# Patient Record
Sex: Female | Born: 1935 | Race: White | Hispanic: No | Smoking: Never smoker
Health system: Southern US, Community
[De-identification: ages and names within clinical notes are randomized; demographics above are authoritative.]

## PROBLEM LIST (undated history)

## (undated) DIAGNOSIS — N184 Chronic kidney disease, stage 4 (severe): Secondary | ICD-10-CM

## (undated) DIAGNOSIS — I82409 Acute embolism and thrombosis of unspecified deep veins of unspecified lower extremity: Secondary | ICD-10-CM

## (undated) DIAGNOSIS — L0390 Cellulitis, unspecified: Secondary | ICD-10-CM

## (undated) DIAGNOSIS — I10 Essential (primary) hypertension: Secondary | ICD-10-CM

## (undated) DIAGNOSIS — I4891 Unspecified atrial fibrillation: Secondary | ICD-10-CM

## (undated) DIAGNOSIS — E119 Type 2 diabetes mellitus without complications: Secondary | ICD-10-CM

## (undated) DIAGNOSIS — G20A1 Parkinson's disease without dyskinesia, without mention of fluctuations: Secondary | ICD-10-CM

## (undated) DIAGNOSIS — D649 Anemia, unspecified: Secondary | ICD-10-CM

## (undated) DIAGNOSIS — M549 Dorsalgia, unspecified: Secondary | ICD-10-CM

## (undated) DIAGNOSIS — G20 Parkinson's disease: Secondary | ICD-10-CM

## (undated) HISTORY — PX: APPENDECTOMY: SHX54

## (undated) HISTORY — PX: TONSILLECTOMY: SUR1361

## (undated) HISTORY — PX: CHOLECYSTECTOMY: SHX55

## (undated) HISTORY — PX: KNEE ARTHROSCOPY: SUR90

---

## 1998-01-19 ENCOUNTER — Ambulatory Visit (HOSPITAL_COMMUNITY): Admission: RE | Admit: 1998-01-19 | Discharge: 1998-01-19 | Payer: Self-pay | Admitting: Obstetrics & Gynecology

## 1998-07-04 ENCOUNTER — Ambulatory Visit (HOSPITAL_COMMUNITY): Admission: RE | Admit: 1998-07-04 | Discharge: 1998-07-04 | Payer: Self-pay

## 1998-07-05 ENCOUNTER — Ambulatory Visit (HOSPITAL_COMMUNITY): Admission: RE | Admit: 1998-07-05 | Discharge: 1998-07-05 | Payer: Self-pay | Admitting: Internal Medicine

## 1998-07-17 ENCOUNTER — Ambulatory Visit (HOSPITAL_COMMUNITY): Admission: RE | Admit: 1998-07-17 | Discharge: 1998-07-17 | Payer: Self-pay

## 1999-09-06 ENCOUNTER — Ambulatory Visit (HOSPITAL_COMMUNITY): Admission: RE | Admit: 1999-09-06 | Discharge: 1999-09-06 | Payer: Self-pay | Admitting: Internal Medicine

## 2000-11-03 ENCOUNTER — Encounter: Payer: Self-pay | Admitting: Internal Medicine

## 2000-11-03 ENCOUNTER — Ambulatory Visit (HOSPITAL_COMMUNITY): Admission: RE | Admit: 2000-11-03 | Discharge: 2000-11-03 | Payer: Self-pay | Admitting: Internal Medicine

## 2002-01-26 ENCOUNTER — Ambulatory Visit (HOSPITAL_COMMUNITY): Admission: RE | Admit: 2002-01-26 | Discharge: 2002-01-26 | Payer: Self-pay | Admitting: Internal Medicine

## 2002-01-26 ENCOUNTER — Encounter: Payer: Self-pay | Admitting: Internal Medicine

## 2003-02-17 ENCOUNTER — Ambulatory Visit (HOSPITAL_COMMUNITY): Admission: RE | Admit: 2003-02-17 | Discharge: 2003-02-17 | Payer: Self-pay | Admitting: Internal Medicine

## 2003-02-17 ENCOUNTER — Encounter: Payer: Self-pay | Admitting: Internal Medicine

## 2016-01-07 ENCOUNTER — Emergency Department (HOSPITAL_BASED_OUTPATIENT_CLINIC_OR_DEPARTMENT_OTHER): Payer: Medicare Other

## 2016-01-07 ENCOUNTER — Inpatient Hospital Stay (HOSPITAL_BASED_OUTPATIENT_CLINIC_OR_DEPARTMENT_OTHER)
Admission: EM | Admit: 2016-01-07 | Discharge: 2016-01-13 | DRG: 469 | Disposition: A | Discharge status: Skilled Nursing Facility | Payer: Medicare Other | Attending: Internal Medicine | Admitting: Internal Medicine

## 2016-01-07 ENCOUNTER — Encounter (HOSPITAL_BASED_OUTPATIENT_CLINIC_OR_DEPARTMENT_OTHER): Payer: Self-pay | Admitting: Emergency Medicine

## 2016-01-07 DIAGNOSIS — Z9049 Acquired absence of other specified parts of digestive tract: Secondary | ICD-10-CM

## 2016-01-07 DIAGNOSIS — I480 Paroxysmal atrial fibrillation: Secondary | ICD-10-CM | POA: Diagnosis present

## 2016-01-07 DIAGNOSIS — D62 Acute posthemorrhagic anemia: Secondary | ICD-10-CM | POA: Diagnosis not present

## 2016-01-07 DIAGNOSIS — S72009A Fracture of unspecified part of neck of unspecified femur, initial encounter for closed fracture: Secondary | ICD-10-CM | POA: Diagnosis present

## 2016-01-07 DIAGNOSIS — J9811 Atelectasis: Secondary | ICD-10-CM | POA: Diagnosis not present

## 2016-01-07 DIAGNOSIS — L03115 Cellulitis of right lower limb: Secondary | ICD-10-CM | POA: Diagnosis present

## 2016-01-07 DIAGNOSIS — D638 Anemia in other chronic diseases classified elsewhere: Secondary | ICD-10-CM | POA: Diagnosis present

## 2016-01-07 DIAGNOSIS — G20 Parkinson's disease: Secondary | ICD-10-CM | POA: Diagnosis present

## 2016-01-07 DIAGNOSIS — Z882 Allergy status to sulfonamides status: Secondary | ICD-10-CM

## 2016-01-07 DIAGNOSIS — Y9301 Activity, walking, marching and hiking: Secondary | ICD-10-CM | POA: Diagnosis present

## 2016-01-07 DIAGNOSIS — Z7901 Long term (current) use of anticoagulants: Secondary | ICD-10-CM | POA: Diagnosis not present

## 2016-01-07 DIAGNOSIS — I9581 Postprocedural hypotension: Secondary | ICD-10-CM | POA: Diagnosis not present

## 2016-01-07 DIAGNOSIS — S72001A Fracture of unspecified part of neck of right femur, initial encounter for closed fracture: Secondary | ICD-10-CM | POA: Diagnosis present

## 2016-01-07 DIAGNOSIS — I482 Chronic atrial fibrillation, unspecified: Secondary | ICD-10-CM

## 2016-01-07 DIAGNOSIS — Z86718 Personal history of other venous thrombosis and embolism: Secondary | ICD-10-CM

## 2016-01-07 DIAGNOSIS — W1830XA Fall on same level, unspecified, initial encounter: Secondary | ICD-10-CM | POA: Diagnosis present

## 2016-01-07 DIAGNOSIS — F329 Major depressive disorder, single episode, unspecified: Secondary | ICD-10-CM | POA: Diagnosis present

## 2016-01-07 DIAGNOSIS — N184 Chronic kidney disease, stage 4 (severe): Secondary | ICD-10-CM | POA: Diagnosis present

## 2016-01-07 DIAGNOSIS — G20A1 Parkinson's disease without dyskinesia, without mention of fluctuations: Secondary | ICD-10-CM

## 2016-01-07 DIAGNOSIS — S72011A Unspecified intracapsular fracture of right femur, initial encounter for closed fracture: Secondary | ICD-10-CM | POA: Diagnosis present

## 2016-01-07 DIAGNOSIS — Z881 Allergy status to other antibiotic agents status: Secondary | ICD-10-CM

## 2016-01-07 DIAGNOSIS — I130 Hypertensive heart and chronic kidney disease with heart failure and stage 1 through stage 4 chronic kidney disease, or unspecified chronic kidney disease: Secondary | ICD-10-CM | POA: Diagnosis present

## 2016-01-07 DIAGNOSIS — G4733 Obstructive sleep apnea (adult) (pediatric): Secondary | ICD-10-CM | POA: Diagnosis present

## 2016-01-07 DIAGNOSIS — R1310 Dysphagia, unspecified: Secondary | ICD-10-CM | POA: Diagnosis present

## 2016-01-07 DIAGNOSIS — R609 Edema, unspecified: Secondary | ICD-10-CM

## 2016-01-07 DIAGNOSIS — R0902 Hypoxemia: Secondary | ICD-10-CM

## 2016-01-07 DIAGNOSIS — Z419 Encounter for procedure for purposes other than remedying health state, unspecified: Secondary | ICD-10-CM

## 2016-01-07 DIAGNOSIS — I5033 Acute on chronic diastolic (congestive) heart failure: Secondary | ICD-10-CM | POA: Diagnosis not present

## 2016-01-07 DIAGNOSIS — Z888 Allergy status to other drugs, medicaments and biological substances status: Secondary | ICD-10-CM | POA: Diagnosis not present

## 2016-01-07 DIAGNOSIS — W19XXXA Unspecified fall, initial encounter: Secondary | ICD-10-CM

## 2016-01-07 DIAGNOSIS — Y92009 Unspecified place in unspecified non-institutional (private) residence as the place of occurrence of the external cause: Secondary | ICD-10-CM | POA: Diagnosis not present

## 2016-01-07 DIAGNOSIS — J80 Acute respiratory distress syndrome: Secondary | ICD-10-CM | POA: Diagnosis not present

## 2016-01-07 DIAGNOSIS — Z993 Dependence on wheelchair: Secondary | ICD-10-CM

## 2016-01-07 DIAGNOSIS — E1122 Type 2 diabetes mellitus with diabetic chronic kidney disease: Secondary | ICD-10-CM | POA: Diagnosis present

## 2016-01-07 DIAGNOSIS — S72001D Fracture of unspecified part of neck of right femur, subsequent encounter for closed fracture with routine healing: Secondary | ICD-10-CM | POA: Diagnosis not present

## 2016-01-07 DIAGNOSIS — J9601 Acute respiratory failure with hypoxia: Secondary | ICD-10-CM | POA: Diagnosis not present

## 2016-01-07 DIAGNOSIS — M25521 Pain in right elbow: Secondary | ICD-10-CM | POA: Diagnosis present

## 2016-01-07 DIAGNOSIS — Z09 Encounter for follow-up examination after completed treatment for conditions other than malignant neoplasm: Secondary | ICD-10-CM

## 2016-01-07 DIAGNOSIS — N179 Acute kidney failure, unspecified: Secondary | ICD-10-CM | POA: Diagnosis not present

## 2016-01-07 DIAGNOSIS — M25552 Pain in left hip: Secondary | ICD-10-CM | POA: Diagnosis present

## 2016-01-07 HISTORY — DX: Unspecified atrial fibrillation: I48.91

## 2016-01-07 HISTORY — DX: Essential (primary) hypertension: I10

## 2016-01-07 HISTORY — DX: Cellulitis, unspecified: L03.90

## 2016-01-07 HISTORY — DX: Anemia, unspecified: D64.9

## 2016-01-07 HISTORY — DX: Acute embolism and thrombosis of unspecified deep veins of unspecified lower extremity: I82.409

## 2016-01-07 HISTORY — DX: Parkinson's disease without dyskinesia, without mention of fluctuations: G20.A1

## 2016-01-07 HISTORY — DX: Parkinson's disease: G20

## 2016-01-07 HISTORY — DX: Chronic kidney disease, stage 4 (severe): N18.4

## 2016-01-07 HISTORY — DX: Dorsalgia, unspecified: M54.9

## 2016-01-07 HISTORY — DX: Type 2 diabetes mellitus without complications: E11.9

## 2016-01-07 LAB — CBC WITH DIFFERENTIAL/PLATELET
BASOS PCT: 0 %
Band Neutrophils: 1 %
Basophils Absolute: 0 10*3/uL (ref 0.0–0.1)
Blasts: 0 %
EOS ABS: 0.3 10*3/uL (ref 0.0–0.7)
EOS PCT: 4 %
HCT: 32.9 % — ABNORMAL LOW (ref 36.0–46.0)
Hemoglobin: 10.4 g/dL — ABNORMAL LOW (ref 12.0–15.0)
Lymphocytes Relative: 4 %
Lymphs Abs: 0.3 10*3/uL — ABNORMAL LOW (ref 0.7–4.0)
MCH: 29.4 pg (ref 26.0–34.0)
MCHC: 31.6 g/dL (ref 30.0–36.0)
MCV: 92.9 fL (ref 78.0–100.0)
METAMYELOCYTES PCT: 1 %
MONO ABS: 0.4 10*3/uL (ref 0.1–1.0)
MYELOCYTES: 0 %
Monocytes Relative: 5 %
Neutro Abs: 6.6 10*3/uL (ref 1.7–7.7)
Neutrophils Relative %: 85 %
PLATELETS: 232 10*3/uL (ref 150–400)
PROMYELOCYTES ABS: 0 %
RBC: 3.54 MIL/uL — ABNORMAL LOW (ref 3.87–5.11)
RDW: 16.8 % — ABNORMAL HIGH (ref 11.5–15.5)
WBC: 7.6 10*3/uL (ref 4.0–10.5)
nRBC: 0 /100 WBC

## 2016-01-07 LAB — BASIC METABOLIC PANEL
Anion gap: 10 (ref 5–15)
BUN: 36 mg/dL — AB (ref 6–20)
CALCIUM: 8.9 mg/dL (ref 8.9–10.3)
CHLORIDE: 106 mmol/L (ref 101–111)
CO2: 23 mmol/L (ref 22–32)
CREATININE: 1.86 mg/dL — AB (ref 0.44–1.00)
GFR calc Af Amer: 28 mL/min — ABNORMAL LOW (ref >60)
GFR calc non Af Amer: 24 mL/min — ABNORMAL LOW (ref >60)
Glucose, Bld: 130 mg/dL — ABNORMAL HIGH (ref 65–99)
Potassium: 3.7 mmol/L (ref 3.5–5.1)
SODIUM: 139 mmol/L (ref 135–145)

## 2016-01-07 LAB — PROTIME-INR
INR: 1.35
PROTHROMBIN TIME: 16.8 s — AB (ref 11.4–15.2)

## 2016-01-07 LAB — CBG MONITORING, ED
GLUCOSE-CAPILLARY: 153 mg/dL — AB (ref 65–99)
Glucose-Capillary: 128 mg/dL — ABNORMAL HIGH (ref 65–99)

## 2016-01-07 MED ORDER — FENTANYL CITRATE (PF) 100 MCG/2ML IJ SOLN
25.0000 ug | Freq: Once | INTRAMUSCULAR | Status: AC
  Administered 2016-01-07: 25 ug via INTRAVENOUS
  Filled 2016-01-07: qty 2

## 2016-01-07 MED ORDER — ACETAMINOPHEN 325 MG PO TABS
650.0000 mg | ORAL_TABLET | Freq: Once | ORAL | Status: AC
  Administered 2016-01-07: 650 mg via ORAL
  Filled 2016-01-07: qty 2

## 2016-01-07 MED ORDER — SODIUM CHLORIDE 0.9 % IV SOLN
INTRAVENOUS | Status: AC
  Administered 2016-01-07 (×2): via INTRAVENOUS

## 2016-01-07 NOTE — ED Notes (Signed)
Pt undressed and in gown. Son at bedside.

## 2016-01-07 NOTE — ED Notes (Signed)
Contacted Dr. Victorino DikeHewitt @ [phone] for consult

## 2016-01-07 NOTE — ED Notes (Signed)
Pt is non ambulatory per son. Pt go up out of her wheelchair and landed on tile floor. Unsure if she hit her head. Pt is c/o R arm pain and L hip pain.

## 2016-01-07 NOTE — ED Triage Notes (Signed)
Per ems, patient slipped and fell while walking at home. Patient is guarding right hip, c/o arm pain & neck pain

## 2016-01-07 NOTE — ED Notes (Signed)
CBG is 153. 

## 2016-01-07 NOTE — ED Notes (Signed)
Pt pulled IV out "because it was hurting". New IV reinserted, patient told to call RN if IV causes pain.

## 2016-01-07 NOTE — ED Notes (Signed)
Pt in 9/10 pain. Asking for pain medication. RN notified.

## 2016-01-07 NOTE — ED Notes (Signed)
[facility] notified Delice Bison(Tara) for hospitalist @ [facility]

## 2016-01-07 NOTE — ED Provider Notes (Signed)
[facility] Provider Note   CSN: [id number] Arrival date & time: 01/07/16  1510 By signing my name below, I, Levon Hedger, attest that this documentation has been prepared under the direction and in the presence of Nelva Nay, MD . Electronically Signed: Levon Hedger, Scribe. 01/07/2016. 6:30 PM  History   Chief Complaint Chief Complaint  Patient presents with  . Fall    HPI Sheila Watkins is a 82 y.o. female with PMHx of Parkinson's, HTN, A-fib, and DM who presents to the Emergency Department s/p fall which occurred at approximately 3 this afternoon. Pt complains of sudden onset right elbow pain. She also complains of left hip pain, which son states chronic, but has worsened after the fall. No alleviating factors noted. Per son, pt fell while walking at home. The fall was unwitnessed and pt's son is unsure of any head injury. Pt is quiet and mostly nonverbal during exam. Per son, "her quietness comes and goes". Pt's son denies O2 usage at home. She is currently taking anticoagulants. Pt is allergic to Sulfa, Clonidine, and Lisinopril.   The history is provided by the patient and a relative. The history is limited by the condition of the patient. No language interpreter was used.   Past Medical History:  Diagnosis Date  . Atrial fibrillation (HCC)   . Back pain   . Cellulitis   . Diabetes mellitus without complication (HCC)   . DVT (deep venous thrombosis) (HCC)   . Hypertension   . Parkinson disease [facility])     Patient Active Problem List   Diagnosis Date Noted  . Hip fracture (HCC) 01/07/2016    Past Surgical History:  Procedure Laterality Date  . APPENDECTOMY    . CHOLECYSTECTOMY    . TONSILLECTOMY      OB History    No data available       Home Medications    Prior to Admission medications   Medication Sig Start Date End Date Taking? Authorizing Provider  ALPRAZolam Prudy Feeler) 0.25 MG tablet Take 0.25 mg by mouth at bedtime as needed for anxiety.    Yes Historical Provider, MD  amlodipine-benazepril (LOTREL) 2.5-10 MG capsule Take 1 capsule by mouth daily.   Yes Historical Provider, MD  carbidopa-levodopa (SINEMET IR) 25-100 MG tablet Take 1 tablet by mouth 3 (three) times daily.   Yes Historical Provider, MD  furosemide (LASIX) 20 MG tablet Take 20 mg by mouth.   Yes Historical Provider, MD  gabapentin (NEURONTIN) 100 MG capsule Take 100 mg by mouth 3 (three) times daily.   Yes Historical Provider, MD  HYDROcodone-acetaminophen (NORCO) 10-325 MG tablet Take 1 tablet by mouth every 6 (six) hours as needed.   Yes Historical Provider, MD  losartan (COZAAR) 50 MG tablet Take 50 mg by mouth daily.   Yes Historical Provider, MD  rOPINIRole (REQUIP) 3 MG tablet Take 3 mg by mouth at bedtime.   Yes Historical Provider, MD  sertraline (ZOLOFT) 50 MG tablet Take 50 mg by mouth daily.   Yes Historical Provider, MD  traMADol (ULTRAM) 50 MG tablet Take by mouth every 6 (six) hours as needed.   Yes Historical Provider, MD  warfarin (COUMADIN) 2 MG tablet Take 2 mg by mouth daily.   Yes Historical Provider, MD    Family History No family history on file.  Social History Social History  Substance Use Topics  . Smoking status: Never Smoker  . Smokeless tobacco: Never Used  . Alcohol use No  Allergies   Clonidine derivatives; Lisinopril; Macrobid [nitrofurantoin monohyd macro]; Sulfa antibiotics; and Valsartan   Review of Systems Review of Systems  Unable to perform ROS: Other   10 Systems reviewed and are negative for acute change except as noted in the HPI.   Physical Exam Updated Vital Signs BP 176/76 (BP Location: Left Arm)   Pulse 87   Temp 98 F (36.7 C) (Oral)   Resp 18   Ht 5\' 8"  (1.727 m)   Wt 170 lb (77.1 kg)   SpO2 96%   BMI 25.85 kg/m   Physical Exam Physical Exam  Nursing note and vitals reviewed. Constitutional: She is oriented to person, place, and time. She appears well-developed and well-nourished. No  distress.  HENT:  Head: Normocephalic and atraumatic.  Eyes: Pupils are equal, round, and reactive to light.  Neck: Tenderness to palpation on cervical spine posteriorly. Cardiovascular: Normal rate and intact distal pulses.   Pulmonary/Chest: No respiratory distress.  Abdominal: Normal appearance. She exhibits no distension.  Musculoskeletal: Shortening and external rotation of right leg.  Tenderness to palpation right and left hip.  Bruising tenderness noted right elbow. Neurological: She is alert and oriented to person, place, and time.  Speech is stuttered which is normal according to son.   Skin: Skin is warm and dry. No rash noted.    ED Treatments / Results  DIAGNOSTIC STUDIES:  Oxygen Saturation is 88% on RA, low by my interpretation.    COORDINATION OF CARE:  3:38 PM Will order CT head, CT cervical spine, DG chest, DH elbow right, CBC, BMP, and Protime-INR. Discussed treatment plan with pt at bedside and pt agreed to plan.   Labs (all labs ordered are listed, but only abnormal results are displayed) Labs Reviewed  BASIC METABOLIC PANEL - Abnormal; Notable for the following:       Result Value   Glucose, Bld 130 (*)    BUN 36 (*)    Creatinine, Ser 1.86 (*)    GFR calc non Af Amer 24 (*)    GFR calc Af Amer 28 (*)    All other components within normal limits  CBC WITH DIFFERENTIAL/PLATELET - Abnormal; Notable for the following:    RBC 3.54 (*)    Hemoglobin 10.4 (*)    HCT 32.9 (*)    RDW 16.8 (*)    Lymphs Abs 0.3 (*)    All other components within normal limits  PROTIME-INR - Abnormal; Notable for the following:    Prothrombin Time 16.8 (*)    All other components within normal limits  CBG MONITORING, ED - Abnormal; Notable for the following:    Glucose-Capillary 128 (*)    All other components within normal limits    EKG  EKG Interpretation  Date/Time:  Sunday January 07 2016 15:55:39 EDT Ventricular Rate:  74 PR Interval:    QRS Duration: 115 QT  Interval:  456 QTC Calculation: 506 R Axis:   -71 Text Interpretation:  Accelerated junctional rhythm Left anterior fascicular block Low voltage, extremity and precordial leads No previous tracing Confirmed by Lagena Strand  MD, Danesha Kirchoff ([id number]) on 01/07/2016 4:22:25 PM       Radiology Dg Chest 1 View  Result Date: 01/07/2016 CLINICAL DATA:  Fall today with right elbow pain, initial encounter EXAM: CHEST 1 VIEW COMPARISON:  03/17/2015 FINDINGS: Cardiac shadow is enlarged. Aortic calcifications are again seen and stable. The lungs are well aerated bilaterally. No rib fractures are seen. IMPRESSION: No acute abnormality noted. Electronically Signed  By: Alcide Clever M.D.   On: 01/07/2016 17:48   Dg Elbow Complete Right  Result Date: 01/07/2016 CLINICAL DATA:  Fall with right elbow pain, initial encounter EXAM: RIGHT ELBOW - COMPLETE 3+ VIEW COMPARISON:  None. FINDINGS: Soft tissue swelling is noted over the proximal ulna. No acute fracture or dislocation is noted. No joint effusion is seen. IMPRESSION: Soft tissue swelling without acute bony abnormality. Electronically Signed   By: Alcide Clever M.D.   On: 01/07/2016 17:49   Ct Head Wo Contrast  Result Date: 01/07/2016 CLINICAL DATA:  Larey Seat from wheelchair. Trauma to the head and neck. Confusion. EXAM: CT HEAD WITHOUT CONTRAST CT CERVICAL SPINE WITHOUT CONTRAST TECHNIQUE: Multidetector CT imaging of the head and cervical spine was performed following the standard protocol without intravenous contrast. Multiplanar CT image reconstructions of the cervical spine were also generated. COMPARISON:  None. 11/30/2014 FINDINGS: CT HEAD FINDINGS There is generalized atrophy. There chronic small-vessel ischemic changes affecting the cerebral hemispheric white matter, thalami I and pons. No sign of acute infarction. No mass lesion, hemorrhage, hydrocephalus or extra-axial collection. No skull fracture. No traumatic fluid in the sinuses, middle ears or mastoids. There is  atherosclerotic calcification of the major vessels at the base of the brain. CT CERVICAL SPINE FINDINGS No fracture. No malalignment. No soft tissue swelling. Ordinary chronic spondylosis and facet arthropathy, typical of age. IMPRESSION: Head CT: No acute or traumatic finding. Atrophy and chronic small vessel disease. Cervical spine CT: No acute or traumatic finding. Ordinary degenerative spondylosis. Electronically Signed   By: Paulina Fusi M.D.   On: 01/07/2016 16:51   Ct Cervical Spine Wo Contrast  Result Date: 01/07/2016 CLINICAL DATA:  Larey Seat from wheelchair. Trauma to the head and neck. Confusion. EXAM: CT HEAD WITHOUT CONTRAST CT CERVICAL SPINE WITHOUT CONTRAST TECHNIQUE: Multidetector CT imaging of the head and cervical spine was performed following the standard protocol without intravenous contrast. Multiplanar CT image reconstructions of the cervical spine were also generated. COMPARISON:  None. 11/30/2014 FINDINGS: CT HEAD FINDINGS There is generalized atrophy. There chronic small-vessel ischemic changes affecting the cerebral hemispheric white matter, thalami I and pons. No sign of acute infarction. No mass lesion, hemorrhage, hydrocephalus or extra-axial collection. No skull fracture. No traumatic fluid in the sinuses, middle ears or mastoids. There is atherosclerotic calcification of the major vessels at the base of the brain. CT CERVICAL SPINE FINDINGS No fracture. No malalignment. No soft tissue swelling. Ordinary chronic spondylosis and facet arthropathy, typical of age. IMPRESSION: Head CT: No acute or traumatic finding. Atrophy and chronic small vessel disease. Cervical spine CT: No acute or traumatic finding. Ordinary degenerative spondylosis. Electronically Signed   By: Paulina Fusi M.D.   On: 01/07/2016 16:51   Dg Hip Unilat W Or Wo Pelvis 2-3 Views Left  Result Date: 01/07/2016 CLINICAL DATA:  Fall with hip pain, initial encounter EXAM: DG HIP (WITH OR WITHOUT PELVIS) 2-3V LEFT  COMPARISON:  11/30/2014 FINDINGS: There is a subcapital femoral neck fracture on the right with some impaction and angulation at the fracture site. Degenerative changes of both hip joints are seen. Pelvic ring is intact. No definitive left hip fracture is seen. IMPRESSION: Subcapital right femoral neck fracture. Electronically Signed   By: Alcide Clever M.D.   On: 01/07/2016 17:50    Procedures Procedures (including critical care time)  Medications Ordered in ED Medications  0.9 %  sodium chloride infusion ( Intravenous New Bag/Given 01/07/16 1604)  acetaminophen (TYLENOL) tablet 650 mg (650 mg Oral  Given 01/07/16 1730)  fentaNYL (SUBLIMAZE) injection 25 mcg (25 mcg Intravenous Given 01/07/16 1846)     Initial Impression / Assessment and Plan / ED Course  I have reviewed the triage vital signs and the nursing notes.  Pertinent labs & imaging results that were available during my care of the patient were reviewed by me and considered in my medical decision making (see chart for details).  Clinical Course  Value Comment By Time  DG Chest 1 View (Reviewed) Nelva Nayobert Alma Mohiuddin, MD 08/20 1754      Final Clinical Impressions(s) / ED Diagnoses   Final diagnoses:  Hip fracture, right, closed, initial encounter [facility](HCC)  Chronic atrial fibrillation (HCC)  I personally performed the services described in this documentation, which was scribed in my presence. The recorded information has been reviewed and considered.   New Prescriptions New Prescriptions   No medications on file     Nelva Nayobert Yuritza Paulhus, MD 01/07/16 [phone]

## 2016-01-07 NOTE — ED Notes (Signed)
[facility] @ [phone]

## 2016-01-08 ENCOUNTER — Inpatient Hospital Stay (HOSPITAL_COMMUNITY): Payer: Medicare Other | Admitting: Anesthesiology

## 2016-01-08 ENCOUNTER — Inpatient Hospital Stay (HOSPITAL_COMMUNITY): Payer: Medicare Other

## 2016-01-08 ENCOUNTER — Encounter (HOSPITAL_COMMUNITY)
Admission: EM | Disposition: A | Discharge status: Skilled Nursing Facility | Payer: Self-pay | Source: Home / Self Care | Attending: Internal Medicine

## 2016-01-08 ENCOUNTER — Encounter (HOSPITAL_COMMUNITY): Payer: Self-pay | Admitting: Internal Medicine

## 2016-01-08 DIAGNOSIS — I482 Chronic atrial fibrillation, unspecified: Secondary | ICD-10-CM

## 2016-01-08 DIAGNOSIS — R609 Edema, unspecified: Secondary | ICD-10-CM

## 2016-01-08 DIAGNOSIS — S72009A Fracture of unspecified part of neck of unspecified femur, initial encounter for closed fracture: Secondary | ICD-10-CM | POA: Diagnosis present

## 2016-01-08 DIAGNOSIS — S72001A Fracture of unspecified part of neck of right femur, initial encounter for closed fracture: Secondary | ICD-10-CM | POA: Diagnosis present

## 2016-01-08 HISTORY — PX: HIP ARTHROPLASTY: SHX981

## 2016-01-08 LAB — TYPE AND SCREEN
ABO/RH(D): A POS
ABO/RH(D): A POS
ANTIBODY SCREEN: NEGATIVE
Antibody Screen: NEGATIVE

## 2016-01-08 LAB — CREATININE, SERUM
Creatinine, Ser: 1.64 mg/dL — ABNORMAL HIGH (ref 0.44–1.00)
GFR calc Af Amer: 32 mL/min — ABNORMAL LOW (ref >60)
GFR, EST NON AFRICAN AMERICAN: 28 mL/min — AB (ref >60)

## 2016-01-08 LAB — COMPREHENSIVE METABOLIC PANEL
ALT: 12 U/L — AB (ref 14–54)
AST: 26 U/L (ref 15–41)
Albumin: 4.2 g/dL (ref 3.5–5.0)
Alkaline Phosphatase: 105 U/L (ref 38–126)
Anion gap: 9 (ref 5–15)
BUN: 29 mg/dL — ABNORMAL HIGH (ref 6–20)
CALCIUM: 9 mg/dL (ref 8.9–10.3)
CHLORIDE: 108 mmol/L (ref 101–111)
CO2: 24 mmol/L (ref 22–32)
CREATININE: 1.57 mg/dL — AB (ref 0.44–1.00)
GFR, EST AFRICAN AMERICAN: 34 mL/min — AB (ref >60)
GFR, EST NON AFRICAN AMERICAN: 29 mL/min — AB (ref >60)
Glucose, Bld: 144 mg/dL — ABNORMAL HIGH (ref 65–99)
Potassium: 3.8 mmol/L (ref 3.5–5.1)
Sodium: 141 mmol/L (ref 135–145)
TOTAL PROTEIN: 7.3 g/dL (ref 6.5–8.1)
Total Bilirubin: 0.8 mg/dL (ref 0.3–1.2)

## 2016-01-08 LAB — DIFFERENTIAL
Basophils Absolute: 0 10*3/uL (ref 0.0–0.1)
Basophils Relative: 0 %
EOS PCT: 1 %
Eosinophils Absolute: 0.1 10*3/uL (ref 0.0–0.7)
LYMPHS ABS: 0.3 10*3/uL — AB (ref 0.7–4.0)
LYMPHS PCT: 3 %
MONO ABS: 0.7 10*3/uL (ref 0.1–1.0)
MONOS PCT: 6 %
NEUTROS ABS: 9.6 10*3/uL — AB (ref 1.7–7.7)
Neutrophils Relative %: 90 %

## 2016-01-08 LAB — CBC
HCT: 34.9 % — ABNORMAL LOW (ref 36.0–46.0)
HEMATOCRIT: 34.5 % — AB (ref 36.0–46.0)
HEMOGLOBIN: 10.4 g/dL — AB (ref 12.0–15.0)
Hemoglobin: 10.9 g/dL — ABNORMAL LOW (ref 12.0–15.0)
MCH: 28.8 pg (ref 26.0–34.0)
MCH: 28 pg (ref 26.0–34.0)
MCHC: 31.2 g/dL (ref 30.0–36.0)
MCHC: 30.1 g/dL (ref 30.0–36.0)
MCV: 92.3 fL (ref 78.0–100.0)
MCV: 93 fL (ref 78.0–100.0)
PLATELETS: 267 10*3/uL (ref 150–400)
Platelets: 223 10*3/uL (ref 150–400)
RBC: 3.78 MIL/uL — AB (ref 3.87–5.11)
RBC: 3.71 MIL/uL — ABNORMAL LOW (ref 3.87–5.11)
RDW: 16.7 % — ABNORMAL HIGH (ref 11.5–15.5)
RDW: 16.5 % — ABNORMAL HIGH (ref 11.5–15.5)
WBC: 10.5 10*3/uL (ref 4.0–10.5)
WBC: 11.8 10*3/uL — ABNORMAL HIGH (ref 4.0–10.5)

## 2016-01-08 LAB — PROTIME-INR
INR: 1.23
PROTHROMBIN TIME: 15.6 s — AB (ref 11.4–15.2)

## 2016-01-08 LAB — C-REACTIVE PROTEIN: CRP: 6.4 mg/dL — ABNORMAL HIGH (ref <1.0)

## 2016-01-08 LAB — GLUCOSE, CAPILLARY
GLUCOSE-CAPILLARY: 140 mg/dL — AB (ref 65–99)
GLUCOSE-CAPILLARY: 136 mg/dL — AB (ref 65–99)
GLUCOSE-CAPILLARY: 121 mg/dL — AB (ref 65–99)

## 2016-01-08 LAB — FIBRINOGEN: FIBRINOGEN: 668 mg/dL — AB (ref 210–475)

## 2016-01-08 LAB — ABO/RH
ABO/RH(D): A POS
ABO/RH(D): A POS

## 2016-01-08 LAB — SEDIMENTATION RATE: SED RATE: 55 mm/h — AB (ref 0–22)

## 2016-01-08 LAB — MRSA PCR SCREENING: MRSA BY PCR: NEGATIVE

## 2016-01-08 SURGERY — HEMIARTHROPLASTY, HIP, DIRECT ANTERIOR APPROACH, FOR FRACTURE
Anesthesia: General | Site: Hip | Laterality: Right

## 2016-01-08 MED ORDER — METHOCARBAMOL 1000 MG/10ML IJ SOLN: 500.0000 mg | Freq: Four times a day (QID) | INTRAVENOUS | Status: DC | PRN

## 2016-01-08 MED ORDER — SODIUM CHLORIDE 0.9 % IV SOLN: INTRAVENOUS | Status: DC | PRN

## 2016-01-08 MED ORDER — ALPRAZOLAM 0.25 MG PO TABS
0.2500 mg | ORAL_TABLET | Freq: Every evening | ORAL | Status: DC | PRN
  Administered 2016-01-10 – 2016-01-13 (×3): 0.25 mg via ORAL
  Filled 2016-01-08 (×3): qty 1

## 2016-01-08 MED ORDER — ENOXAPARIN SODIUM 30 MG/0.3ML ~~LOC~~ SOLN
30.0000 mg | SUBCUTANEOUS | Status: DC
  Administered 2016-01-09 – 2016-01-10 (×2): 30 mg via SUBCUTANEOUS
  Filled 2016-01-08 (×2): qty 0.3

## 2016-01-08 MED ORDER — PROPOFOL 10 MG/ML IV BOLUS
INTRAVENOUS | Status: DC | PRN
  Administered 2016-01-08: 50 mg via INTRAVENOUS

## 2016-01-08 MED ORDER — AMLODIPINE BESYLATE 5 MG PO TABS: 2.5000 mg | ORAL_TABLET | Freq: Every day | ORAL | Status: DC

## 2016-01-08 MED ORDER — FERROUS SULFATE 325 (65 FE) MG PO TABS
325.0000 mg | ORAL_TABLET | Freq: Three times a day (TID) | ORAL | Status: DC
  Administered 2016-01-08 – 2016-01-10 (×7): 325 mg via ORAL
  Filled 2016-01-08 (×7): qty 1

## 2016-01-08 MED ORDER — ACETAMINOPHEN 325 MG PO TABS
650.0000 mg | ORAL_TABLET | Freq: Four times a day (QID) | ORAL | Status: DC | PRN
Start: 2016-01-08 — End: 2016-01-13
  Administered 2016-01-09: 650 mg via ORAL
  Filled 2016-01-08: qty 2

## 2016-01-08 MED ORDER — FENTANYL CITRATE (PF) 100 MCG/2ML IJ SOLN
INTRAMUSCULAR | Status: AC
  Filled 2016-01-08: qty 2

## 2016-01-08 MED ORDER — EPHEDRINE SULFATE 50 MG/ML IJ SOLN
INTRAMUSCULAR | Status: DC | PRN
  Administered 2016-01-08 (×2): 10 mg via INTRAVENOUS

## 2016-01-08 MED ORDER — SUGAMMADEX SODIUM 500 MG/5ML IV SOLN
INTRAVENOUS | Status: AC
  Filled 2016-01-08: qty 5

## 2016-01-08 MED ORDER — METHOCARBAMOL 500 MG PO TABS: 500.0000 mg | ORAL_TABLET | Freq: Four times a day (QID) | ORAL | Status: DC | PRN

## 2016-01-08 MED ORDER — METHOCARBAMOL 1000 MG/10ML IJ SOLN
500.0000 mg | Freq: Four times a day (QID) | INTRAVENOUS | Status: DC | PRN
  Filled 2016-01-08: qty 5

## 2016-01-08 MED ORDER — MENTHOL 3 MG MT LOZG: 1.0000 | LOZENGE | OROMUCOSAL | Status: DC | PRN

## 2016-01-08 MED ORDER — HYDROMORPHONE HCL 1 MG/ML IJ SOLN
0.5000 mg | INTRAMUSCULAR | Status: DC | PRN
  Administered 2016-01-08 – 2016-01-09 (×4): 0.5 mg via INTRAVENOUS
  Filled 2016-01-08 (×4): qty 1

## 2016-01-08 MED ORDER — FUROSEMIDE 20 MG PO TABS: 20.0000 mg | ORAL_TABLET | Freq: Every day | ORAL | Status: DC

## 2016-01-08 MED ORDER — LACTATED RINGERS IV SOLN
INTRAVENOUS | Status: DC
  Administered 2016-01-08 (×2): via INTRAVENOUS

## 2016-01-08 MED ORDER — CEFAZOLIN SODIUM-DEXTROSE 2-3 GM-% IV SOLR
INTRAVENOUS | Status: DC | PRN
  Administered 2016-01-08: 2 g via INTRAVENOUS

## 2016-01-08 MED ORDER — TRANEXAMIC ACID 1000 MG/10ML IV SOLN
1000.0000 mg | INTRAVENOUS | Status: DC
  Filled 2016-01-08 (×2): qty 10

## 2016-01-08 MED ORDER — PHENOL 1.4 % MT LIQD: 1.0000 | OROMUCOSAL | Status: DC | PRN

## 2016-01-08 MED ORDER — HYDROCODONE-ACETAMINOPHEN 5-325 MG PO TABS: 1.0000 | ORAL_TABLET | Freq: Four times a day (QID) | ORAL | Status: DC | PRN

## 2016-01-08 MED ORDER — ACETAMINOPHEN 650 MG RE SUPP
650.0000 mg | Freq: Four times a day (QID) | RECTAL | Status: DC | PRN
Start: 2016-01-08 — End: 2016-01-08

## 2016-01-08 MED ORDER — ROCURONIUM BROMIDE 10 MG/ML (PF) SYRINGE
PREFILLED_SYRINGE | INTRAVENOUS | Status: DC | PRN
  Administered 2016-01-08: 10 mg via INTRAVENOUS
  Administered 2016-01-08: 20 mg via INTRAVENOUS
  Administered 2016-01-08: 40 mg via INTRAVENOUS
  Administered 2016-01-08: 30 mg via INTRAVENOUS

## 2016-01-08 MED ORDER — SODIUM CHLORIDE 0.9 % IV SOLN
INTRAVENOUS | Status: AC
  Administered 2016-01-08: 01:00:00 via INTRAVENOUS

## 2016-01-08 MED ORDER — SUGAMMADEX SODIUM 200 MG/2ML IV SOLN
INTRAVENOUS | Status: DC | PRN
  Administered 2016-01-08: 200 mg via INTRAVENOUS

## 2016-01-08 MED ORDER — ONDANSETRON HCL 4 MG PO TABS: 4.0000 mg | ORAL_TABLET | Freq: Four times a day (QID) | ORAL | Status: DC | PRN

## 2016-01-08 MED ORDER — SODIUM CHLORIDE 0.9 % IR SOLN
Status: DC | PRN
  Administered 2016-01-08: 1000 mL

## 2016-01-08 MED ORDER — PHENYLEPHRINE 40 MCG/ML (10ML) SYRINGE FOR IV PUSH (FOR BLOOD PRESSURE SUPPORT)
PREFILLED_SYRINGE | INTRAVENOUS | Status: DC | PRN
  Administered 2016-01-08: 40 ug via INTRAVENOUS

## 2016-01-08 MED ORDER — LIDOCAINE 2% (20 MG/ML) 5 ML SYRINGE
INTRAMUSCULAR | Status: DC | PRN
  Administered 2016-01-08: 60 mg via INTRAVENOUS

## 2016-01-08 MED ORDER — GABAPENTIN 100 MG PO CAPS
100.0000 mg | ORAL_CAPSULE | Freq: Three times a day (TID) | ORAL | Status: DC
  Administered 2016-01-08 – 2016-01-10 (×5): 100 mg via ORAL
  Filled 2016-01-08 (×7): qty 1

## 2016-01-08 MED ORDER — ACETAMINOPHEN 650 MG RE SUPP: 650.0000 mg | Freq: Four times a day (QID) | RECTAL | Status: DC | PRN

## 2016-01-08 MED ORDER — TRAMADOL HCL 50 MG PO TABS
50.0000 mg | ORAL_TABLET | Freq: Four times a day (QID) | ORAL | Status: DC | PRN
  Administered 2016-01-08 – 2016-01-10 (×2): 50 mg via ORAL
  Filled 2016-01-08 (×2): qty 1

## 2016-01-08 MED ORDER — CHLORHEXIDINE GLUCONATE 4 % EX LIQD: 60.0000 mL | Freq: Once | CUTANEOUS | Status: DC

## 2016-01-08 MED ORDER — METOCLOPRAMIDE HCL 5 MG/ML IJ SOLN: 5.0000 mg | Freq: Three times a day (TID) | INTRAMUSCULAR | Status: DC | PRN

## 2016-01-08 MED ORDER — SENNOSIDES-DOCUSATE SODIUM 8.6-50 MG PO TABS: 1.0000 | ORAL_TABLET | Freq: Every evening | ORAL | Status: DC | PRN

## 2016-01-08 MED ORDER — ACETAMINOPHEN 10 MG/ML IV SOLN
1000.0000 mg | INTRAVENOUS | Status: DC
  Filled 2016-01-08: qty 100

## 2016-01-08 MED ORDER — BUPIVACAINE-EPINEPHRINE (PF) 0.5% -1:200000 IJ SOLN
INTRAMUSCULAR | Status: DC | PRN
  Administered 2016-01-08: 30 mL

## 2016-01-08 MED ORDER — SERTRALINE HCL 50 MG PO TABS
50.0000 mg | ORAL_TABLET | Freq: Every day | ORAL | Status: DC
  Administered 2016-01-08 – 2016-01-13 (×6): 50 mg via ORAL
  Filled 2016-01-08 (×7): qty 1

## 2016-01-08 MED ORDER — VANCOMYCIN HCL IN DEXTROSE 1-5 GM/200ML-% IV SOLN
1000.0000 mg | Freq: Once | INTRAVENOUS | Status: AC
  Administered 2016-01-08: 1000 mg via INTRAVENOUS
  Filled 2016-01-08: qty 200

## 2016-01-08 MED ORDER — SODIUM CHLORIDE 0.9 % IV SOLN: 500.0000 mg | INTRAVENOUS | Status: DC

## 2016-01-08 MED ORDER — MORPHINE SULFATE (PF) 2 MG/ML IV SOLN: 0.5000 mg | INTRAVENOUS | Status: DC | PRN

## 2016-01-08 MED ORDER — WARFARIN SODIUM 4 MG PO TABS
4.0000 mg | ORAL_TABLET | Freq: Once | ORAL | Status: AC
  Administered 2016-01-08: 4 mg via ORAL
  Filled 2016-01-08 (×2): qty 1

## 2016-01-08 MED ORDER — ONDANSETRON HCL 4 MG/2ML IJ SOLN: 4.0000 mg | Freq: Three times a day (TID) | INTRAMUSCULAR | Status: DC | PRN

## 2016-01-08 MED ORDER — KETOROLAC TROMETHAMINE 30 MG/ML IJ SOLN
INTRAMUSCULAR | Status: AC
  Filled 2016-01-08: qty 1

## 2016-01-08 MED ORDER — ROPINIROLE HCL 1 MG PO TABS
3.0000 mg | ORAL_TABLET | Freq: Every day | ORAL | Status: DC
  Administered 2016-01-08 – 2016-01-09 (×2): 3 mg via ORAL
  Filled 2016-01-08 (×3): qty 3

## 2016-01-08 MED ORDER — FENTANYL CITRATE (PF) 100 MCG/2ML IJ SOLN
INTRAMUSCULAR | Status: DC | PRN
  Administered 2016-01-08 (×4): 50 ug via INTRAVENOUS

## 2016-01-08 MED ORDER — HYDROCODONE-ACETAMINOPHEN 5-325 MG PO TABS
1.0000 | ORAL_TABLET | Freq: Four times a day (QID) | ORAL | Status: DC | PRN
  Administered 2016-01-08 – 2016-01-10 (×5): 1 via ORAL
  Filled 2016-01-08 (×3): qty 1
  Filled 2016-01-08: qty 2
  Filled 2016-01-08: qty 1

## 2016-01-08 MED ORDER — ONDANSETRON HCL 4 MG/2ML IJ SOLN: 4.0000 mg | Freq: Four times a day (QID) | INTRAMUSCULAR | Status: DC | PRN

## 2016-01-08 MED ORDER — BUPIVACAINE-EPINEPHRINE (PF) 0.5% -1:200000 IJ SOLN
INTRAMUSCULAR | Status: AC
  Filled 2016-01-08: qty 30

## 2016-01-08 MED ORDER — TRANEXAMIC ACID 1000 MG/10ML IV SOLN
INTRAVENOUS | Status: DC | PRN
  Administered 2016-01-08: 1000 mg via INTRAVENOUS

## 2016-01-08 MED ORDER — CEFAZOLIN SODIUM-DEXTROSE 2-4 GM/100ML-% IV SOLN
2.0000 g | Freq: Two times a day (BID) | INTRAVENOUS | Status: AC
  Administered 2016-01-09: 2 g via INTRAVENOUS
  Filled 2016-01-08: qty 100

## 2016-01-08 MED ORDER — SODIUM CHLORIDE 0.9 % IJ SOLN
INTRAMUSCULAR | Status: DC | PRN
  Administered 2016-01-08: 30 mL

## 2016-01-08 MED ORDER — SENNA 8.6 MG PO TABS
1.0000 | ORAL_TABLET | Freq: Two times a day (BID) | ORAL | Status: DC
  Administered 2016-01-08 – 2016-01-13 (×10): 8.6 mg via ORAL
  Filled 2016-01-08 (×12): qty 1

## 2016-01-08 MED ORDER — SENNA 8.6 MG PO TABS
1.0000 | ORAL_TABLET | Freq: Two times a day (BID) | ORAL | Status: DC
  Administered 2016-01-08: 8.6 mg via ORAL
  Filled 2016-01-08: qty 1

## 2016-01-08 MED ORDER — POVIDONE-IODINE 10 % EX SWAB: 2.0000 "application " | Freq: Once | CUTANEOUS | Status: DC

## 2016-01-08 MED ORDER — ACETAMINOPHEN 10 MG/ML IV SOLN
INTRAVENOUS | Status: DC | PRN
  Administered 2016-01-08: 1000 mg via INTRAVENOUS

## 2016-01-08 MED ORDER — 0.9 % SODIUM CHLORIDE (POUR BTL) OPTIME
TOPICAL | Status: DC | PRN
  Administered 2016-01-08: 1000 mL

## 2016-01-08 MED ORDER — SODIUM CHLORIDE 0.9 % IR SOLN
Status: DC | PRN
  Administered 2016-01-08: 3000 mL

## 2016-01-08 MED ORDER — KETOROLAC TROMETHAMINE 30 MG/ML IJ SOLN
INTRAMUSCULAR | Status: DC | PRN
  Administered 2016-01-08: 30 mg

## 2016-01-08 MED ORDER — ONDANSETRON HCL 4 MG/2ML IJ SOLN
INTRAMUSCULAR | Status: DC | PRN
Start: 2016-01-08 — End: 2016-01-08
  Administered 2016-01-08: 4 mg via INTRAVENOUS

## 2016-01-08 MED ORDER — PHENYLEPHRINE HCL 10 MG/ML IJ SOLN
INTRAVENOUS | Status: DC | PRN
  Administered 2016-01-08: 25 ug/min via INTRAVENOUS

## 2016-01-08 MED ORDER — METOCLOPRAMIDE HCL 5 MG PO TABS: 5.0000 mg | ORAL_TABLET | Freq: Three times a day (TID) | ORAL | Status: DC | PRN

## 2016-01-08 MED ORDER — INSULIN ASPART 100 UNIT/ML ~~LOC~~ SOLN: 0.0000 [IU] | Freq: Three times a day (TID) | SUBCUTANEOUS | Status: DC

## 2016-01-08 MED ORDER — SODIUM CHLORIDE 0.9% FLUSH: 3.0000 mL | Freq: Two times a day (BID) | INTRAVENOUS | Status: DC

## 2016-01-08 MED ORDER — CARBIDOPA-LEVODOPA 25-100 MG PO TABS
1.0000 | ORAL_TABLET | Freq: Three times a day (TID) | ORAL | Status: DC
  Administered 2016-01-08 – 2016-01-10 (×5): 1 via ORAL
  Filled 2016-01-08 (×5): qty 1

## 2016-01-08 MED ORDER — FENTANYL CITRATE (PF) 100 MCG/2ML IJ SOLN: 25.0000 ug | INTRAMUSCULAR | Status: DC | PRN

## 2016-01-08 MED ORDER — VANCOMYCIN HCL IN DEXTROSE 750-5 MG/150ML-% IV SOLN
750.0000 mg | INTRAVENOUS | Status: DC
  Administered 2016-01-09: 750 mg via INTRAVENOUS
  Filled 2016-01-08: qty 150

## 2016-01-08 MED ORDER — WARFARIN - PHARMACIST DOSING INPATIENT
Freq: Every day | Status: DC
  Filled 2016-01-08: qty 1

## 2016-01-08 MED ORDER — DOCUSATE SODIUM 100 MG PO CAPS
100.0000 mg | ORAL_CAPSULE | Freq: Two times a day (BID) | ORAL | Status: DC
  Administered 2016-01-08 – 2016-01-13 (×8): 100 mg via ORAL
  Filled 2016-01-08 (×10): qty 1

## 2016-01-08 MED ORDER — ACETAMINOPHEN 325 MG PO TABS: 650.0000 mg | ORAL_TABLET | Freq: Four times a day (QID) | ORAL | Status: DC | PRN

## 2016-01-08 MED ORDER — SODIUM CHLORIDE 0.9 % IV SOLN
INTRAVENOUS | Status: DC
  Administered 2016-01-09: 04:00:00 via INTRAVENOUS

## 2016-01-08 MED ORDER — CEFAZOLIN SODIUM-DEXTROSE 2-4 GM/100ML-% IV SOLN
2.0000 g | INTRAVENOUS | Status: DC
  Filled 2016-01-08 (×2): qty 100

## 2016-01-08 SURGICAL SUPPLY — 55 items
BLADE SAW SGTL 18X1.27X75 (BLADE) IMPLANT
BLADE SAW SGTL 18X1.27X75MM (BLADE)
BLADE SURG ROTATE 9660 (MISCELLANEOUS) IMPLANT
CAPT HIP HEMI 2 ×3 IMPLANT
CHLORAPREP W/TINT 26ML (MISCELLANEOUS) ×3 IMPLANT
COVER SURGICAL LIGHT HANDLE (MISCELLANEOUS) ×3 IMPLANT
DERMABOND ADVANCED (GAUZE/BANDAGES/DRESSINGS) ×4
DERMABOND ADVANCED .7 DNX12 (GAUZE/BANDAGES/DRESSINGS) ×2 IMPLANT
DRAPE C-ARM 42X72 X-RAY (DRAPES) ×3 IMPLANT
DRAPE IMP U-DRAPE 54X76 (DRAPES) ×6 IMPLANT
DRAPE STERI IOBAN 125X83 (DRAPES) ×3 IMPLANT
DRAPE U-SHAPE 47X51 STRL (DRAPES) ×9 IMPLANT
DRSG AQUACEL AG ADV 3.5X10 (GAUZE/BANDAGES/DRESSINGS) ×3 IMPLANT
ELECT BLADE 4.0 EZ CLEAN MEGAD (MISCELLANEOUS) ×3
ELECT REM PT RETURN 9FT ADLT (ELECTROSURGICAL) ×3
ELECTRODE BLDE 4.0 EZ CLN MEGD (MISCELLANEOUS) ×1 IMPLANT
ELECTRODE REM PT RTRN 9FT ADLT (ELECTROSURGICAL) ×1 IMPLANT
EVACUATOR 1/8 PVC DRAIN (DRAIN) IMPLANT
GLOVE BIO SURGEON STRL SZ8.5 (GLOVE) ×6 IMPLANT
GLOVE BIOGEL PI IND STRL 8.5 (GLOVE) ×1 IMPLANT
GLOVE BIOGEL PI INDICATOR 8.5 (GLOVE) ×2
GLOVE ECLIPSE 6.0 STRL STRAW (GLOVE) ×3 IMPLANT
GOWN STRL REUS W/ TWL LRG LVL3 (GOWN DISPOSABLE) ×2 IMPLANT
GOWN STRL REUS W/TWL 2XL LVL3 (GOWN DISPOSABLE) ×3 IMPLANT
GOWN STRL REUS W/TWL LRG LVL3 (GOWN DISPOSABLE) ×4
HANDPIECE INTERPULSE COAX TIP (DISPOSABLE) ×2
HOOD PEEL AWAY FACE SHEILD DIS (HOOD) IMPLANT
HOOD PEEL AWAY FLYTE STAYCOOL (MISCELLANEOUS) ×6 IMPLANT
KIT BASIN OR (CUSTOM PROCEDURE TRAY) ×3 IMPLANT
KIT ROOM TURNOVER OR (KITS) ×3 IMPLANT
MANIFOLD NEPTUNE II (INSTRUMENTS) ×3 IMPLANT
MARKER SKIN DUAL TIP RULER LAB (MISCELLANEOUS) ×3 IMPLANT
NEEDLE SPNL 18GX3.5 QUINCKE PK (NEEDLE) ×3 IMPLANT
NS IRRIG 1000ML POUR BTL (IV SOLUTION) ×3 IMPLANT
PACK TOTAL JOINT (CUSTOM PROCEDURE TRAY) ×3 IMPLANT
PACK UNIVERSAL I (CUSTOM PROCEDURE TRAY) ×3 IMPLANT
PAD ARMBOARD 7.5X6 YLW CONV (MISCELLANEOUS) ×6 IMPLANT
SAW OSC TIP CART 19.5X105X1.3 (SAW) ×3 IMPLANT
SEALER BIPOLAR AQUA 6.0 (INSTRUMENTS) ×3 IMPLANT
SET HNDPC FAN SPRY TIP SCT (DISPOSABLE) ×1 IMPLANT
SUCTION FRAZIER HANDLE 10FR (MISCELLANEOUS)
SUCTION TUBE FRAZIER 10FR DISP (MISCELLANEOUS) IMPLANT
SUT ETHIBOND NAB CT1 #1 30IN (SUTURE) ×6 IMPLANT
SUT MNCRL AB 3-0 PS2 18 (SUTURE) ×3 IMPLANT
SUT MON AB 2-0 CT1 36 (SUTURE) ×3 IMPLANT
SUT VIC AB 1 CT1 27 (SUTURE) ×2
SUT VIC AB 1 CT1 27XBRD ANBCTR (SUTURE) ×1 IMPLANT
SUT VIC AB 2-0 CT1 27 (SUTURE) ×2
SUT VIC AB 2-0 CT1 TAPERPNT 27 (SUTURE) ×1 IMPLANT
SUT VLOC 180 0 24IN GS25 (SUTURE) ×3 IMPLANT
SYR 50ML LL SCALE MARK (SYRINGE) ×3 IMPLANT
TOWEL OR 17X24 6PK STRL BLUE (TOWEL DISPOSABLE) ×6 IMPLANT
TOWEL OR 17X26 10 PK STRL BLUE (TOWEL DISPOSABLE) ×3 IMPLANT
TRAY FOLEY CATH 16FR SILVER (SET/KITS/TRAYS/PACK) ×3 IMPLANT
WATER STERILE IRR 1000ML POUR (IV SOLUTION) ×3 IMPLANT

## 2016-01-08 NOTE — Interval H&P Note (Signed)
History and Physical Interval Note:  01/08/2016 3:42 PM  Sheila Watkins  has presented today for surgery, with the diagnosis of Right Hip fracture  The various methods of treatment have been discussed with the patient and family. After consideration of risks, benefits and other options for treatment, the patient has consented to  Procedure(s): ARTHROPLASTY BIPOLAR HIP (HEMIARTHROPLASTY) (Right) as a surgical intervention .  The patient's history has been reviewed, patient examined, no change in status, stable for surgery.  I have reviewed the patient's chart and labs.  Questions were answered to the patient's satisfaction.     Jamarcus Laduke, Cloyde ReamsBrian James

## 2016-01-08 NOTE — Transfer of Care (Signed)
Immediate Anesthesia Transfer of Care Note  Patient: Sheila MemsRuth L Welke  Procedure(s) Performed: Procedure(s): ANTERIOR APPROACH ARTHROPLASTY BIPOLAR HIP (HEMIARTHROPLASTY) (Right)  Patient Location: PACU  Anesthesia Type:General  Level of Consciousness: awake, alert , oriented and patient cooperative  Airway & Oxygen Therapy: Patient Spontanous Breathing and Patient connected to nasal cannula oxygen  Post-op Assessment: Report given to RN and Post -op Vital signs reviewed and stable  Post vital signs: Reviewed and stable  Last Vitals:  Vitals:   01/08/16 1230 01/08/16 1838  BP: (!) 149/58 (!) 145/57  Pulse: 74 73  Resp: 16 (!) 21  Temp: 36.8 C     Last Pain:  Vitals:   01/08/16 1230  TempSrc: Oral  PainSc:          Complications: No apparent anesthesia complications

## 2016-01-08 NOTE — Progress Notes (Addendum)
[place] OF CARE NOTE Patient: Sheila Watkins ERD:[id number]   PCP: Red Christians, MD DOB: [birth year]   DOA: 01/07/2016   DOS: 01/08/2016    Patient was admitted by my colleague Dr. Maudie Mercury earlier on 01/08/2016. I have reviewed the H&P as well as assessment and plan and agree with the same. Important changes in the plan are listed below.  Physical Exam: Vitals:   01/07/16 2245 01/07/16 2300 01/08/16 0050 01/08/16 0332  BP: 163/68 161/72 137/64 (!) 169/59  Pulse: 76 77 66 65  Resp: 23 23 20 18   Temp:   98.4 F (36.9 C) 98.1 F (36.7 C)  TempSrc:   Oral Oral  SpO2: 95% 97% 97% 97%  Weight:   78.4 kg (172 lb 13.5 oz) 78.4 kg (172 lb 13.5 oz)  Height:   5' 7"  (1.702 m)     General: Alert, Awake and Oriented to Time, Place and Person. Appear in moderate distress Eyes: PERRL, Conjunctiva normal ENT: Oral Mucosa clear moist. Neck: difficult to assess JVD, no Abnormal Mass Or lumps Cardiovascular: S1 and S2 Present, no Murmur, Peripheral Pulses Present Respiratory: Bilateral Air entry equal and Decreased, Clear to Auscultation, no Crackles, no wheezes Abdomen: Bowel Sound present, Soft and no tenderness Skin: redness right leg, no Rash  Extremities: right Pedal edema, no calf tenderness Neurologic: Grossly no focal neuro deficit. Bilaterally Equal motor strength  Assessment and plan  1. Preoperative medical evaluation No Coronary revascularization/CVA within 5 years. No Recent stress test. Does not Climb flight of stair, participates in recreational activity,does household chores. No Prior adverse event with anesthesia. No Alcohol use, drug use.  A) Cardiac risk: Based on RCRI   With this the patient is a moderate risk for adverse Cardiac outcome from surgery. Recommend no further work up At present.  Be watchful of hydration since the pt has history of CHF. Monitor Ins and Out. Continue hold warfarin, Patient will need heparin anticoagulation postoperatively as  reason for being on anticoagulation with warfarin and his A. Fib as well as remote history of DVT. Request surgery to inform attending M.D. At [facility] regarding the timing of initiation of  Therapeutic anticoagulation.  Hold all antihypertensive medicationsand diuretics.  B) Pulmonary risk: Recommend optimization of lung function with use of incentive spirometry and good pulmunary toilet.  C) Bleeding risk The pt will be place on SCD.  Will request Surgeon to please Notify the attending M.D. At [facility] regarding timing of initiation of therapeutic anticoagulation, when OK from Surgeon's standpoint post op.  other recommendations Patient has Parkinson's disease and on and off confusion and is at high risk for developing delirium postoperatively. Please monitor for the same.  Patient has chronic kidney disease Stage IV, ongoing since 2016 Will likely need nephrology consultation postoperatively.  2. Anemia Schistocytes noted as well as left shift noted on peripheral smear. We'll request technologist review of the smear again. Check fibrinogen level. And recheck differential. With normal INR, normal platelets and chronic kidney disease chances of any hemolytic Process at present ongoing is less. Check blood culture. Check HIV.  3. Cellulitis. Patient has right lower extremity mild swelling as well as redness. We will check vascular Doppler. Check ESR and CRP. Patient mentions that she has been recently checked for #the same location as well. Recently treated with antibiotic as an outpatient. We'll start with IV Vancomycin per pharmacy.  4.Chronic anti-coagulation. Atrial fibrillation. History of DVT remote. As per patient's son she has a remote  history of DVT without any recent diagnosis. I do not find any recent evaluation for the DVT in Care everywhere as well. Patient is on anticoagulation with warfarin. Recent INR was 4 on Thursday and therefore she has not received  any warfarin on Thursday and Friday. She received half of her usual dose on Saturday. And after that she has not received any further dosing for warfarin.  Author: Berle Mull, MD [facility] Pager: [phone] 01/08/2016 8:26 AM   If 7PM-7AM, please contact night-coverage at [email or web address], password [facility]

## 2016-01-08 NOTE — Progress Notes (Signed)
ANTICOAGULATION CONSULT NOTE - Initial Consult  Pharmacy Consult for Coumadin Indication: atrial fibrillation and VTE prophylaxis post-op hip surgery  Allergies  Allergen Reactions  . Clonidine Derivatives     Reaction unknown  . Lisinopril     Reaction unknown  . Macrobid [Nitrofurantoin [employer] Macro] Hives  . Sulfa Antibiotics     Reaction unknown  . Valsartan     Reaction unknown    Patient Measurements: Height: 5\' 7"  (170.2 cm) Weight: 172 lb (78 kg) IBW/kg (Calculated) : 61.6  Vital Signs: Temp: 97.8 F (36.6 C) (08/21 1951) Temp Source: Axillary (08/21 1951) BP: 125/54 (08/21 1951) Pulse Rate: 63 (08/21 1951)  Labs:  Recent Labs  01/07/16 1600 01/08/16 0539 01/08/16 2050  HGB 10.4* 10.9* 10.4*  HCT 32.9* 34.9* 34.5*  PLT 232 267 223  LABPROT 16.8* 15.6*  --   INR 1.35 1.23  --   CREATININE 1.86* 1.57* 1.64*    Estimated Creatinine Clearance: 27.5 mL/min (by C-G formula based on SCr of 1.64 mg/dL).   Medical History: Past Medical History:  Diagnosis Date  . Anemia   . Atrial fibrillation (HCC)   . Back pain   . Cellulitis   . CKD (chronic kidney disease) stage 4, GFR 15-29 ml/min (HCC)   . Diabetes mellitus without complication (HCC)   . DVT (deep venous thrombosis) (HCC)   . Hypertension   . Parkinson disease [facility](HCC)    Assessment:   82 yr old female on Coumadin prior to admission for atrial fibrillation and remote hx DVT.   Admit INR 1.35 on 01/07/16.  Coumadin held for hip surgery today, and to resume tonight.   To begin Lovenox 30 mg SQ q24hrs in am per Ortho. Prophylactic dose for crcl < 30 ml/min.  Dopplers negative for DVT today.     Home Coumadin regimen: 4 mg Sun, Wed and Fridays; 1 mg Mon, Tues, Thurs and Saturdays.  Last outpatient dose 8/19 = 1 mg. Uses 2 mg tablets.  Goal of Therapy:  INR 2-3 Monitor platelets by anticoagulation protocol: Yes   Plan:   Coumadin 4 mg x 1 tonight.  Daily PT/INR.  Lovenox 30 mg sq q24hrs to begin  in am.  Will follow up for okay per Ortho to increase to full-dose parenteral anticoagulant, until INR >2.  Dennie FettersEgan, Elysabeth Aust Donovan, RPh Pager: [phone] 01/08/2016,9:19 PM

## 2016-01-08 NOTE — Progress Notes (Signed)
PT Cancellation Note  Patient Details Name: Sheila Watkins MRN: [id number] DOB: 07/12/1929   Cancelled Treatment:    Reason Eval/Treat Not Completed: Patient not medically ready. [facility]. Wil sign off. Please reorder once appropriate. Thanks.   Sheila AlertJannie Sheffield Watkins, MPT Pager: [phone]

## 2016-01-08 NOTE — Op Note (Signed)
OPERATIVE REPORT  SURGEON: Samson FredericBrian Shyquan Stallbaumer, MD   ASSISTANT: April Green, RNFA.  PREOPERATIVE DIAGNOSIS: Displaced Right femoral neck fracture.   POSTOPERATIVE DIAGNOSIS: Displaced Right femoral neck fracture.   PROCEDURE: Right hip hemiarthroplasty, anterior approach.   IMPLANTS: DePuy Tri Lock stem, size 5, std offset, with a -3 mm spacer and a 45 mm monopolar head ball.  ANESTHESIA:  General  ANTIBIOTICS: 2g ancef.  ESTIMATED BLOOD LOSS: 250 mL.  DRAINS: None.  COMPLICATIONS: None   CONDITION: PACU - hemodynamically stable.   BRIEF CLINICAL NOTE: Sheila Watkins is a 82 y.o. female with a displaced Right femoral neck fracture. The patient was admitted to the hospitalist service and underwent perioperative risk stratification and medical optimization. The risks, benefits, and alternatives to hemiarthroplasty were explained, and the patient / family elected to proceed.  PROCEDURE IN DETAIL: The patient was taken to the operating room and general anesthesia was induced on the hospital bed. The patient was then positioned on the Hana table. All bony prominences were well padded. The hip was prepped and draped in the normal sterile surgical fashion. A time-out was called verifying side and site of surgery. Antibiotics were given within 60 minutes of beginning the procedure.  The direct anterior approach to the hip was performed through the Hueter interval. Lateral femoral circumflex vessels were treated with the Auqumantys. The anterior capsule was exposed and an inverted T capsulotomy was made. Fracture hematoma was encountered and evacuated. The patient was found to have a comminuted Right subcapital femoral neck fracture. I freshened the femoral neck cut with a saw. I removed the femoral neck fragment. A corkscrew was placed into the head and the head was removed. This was passed to the back table and was measured.  Acetabular exposure was achieved. I examined the  articular cartilage which was intact. The labrum was intact. A 45 mm trial head was placed and found to have excellent fit.  I then gained femoral exposure taking care to protect the abductors and greater trochanter. This was performed using standard external rotation, extension, and adduction. The capsule was peeled off the inner aspect of the greater trochanter, taking care to preserve the short external rotators. A cookie cutter was used to enter the femoral canal, and then the femoral canal finder was used to confirm location. I then sequentially broached up to a size 5. Calcar planer was used on the femoral neck remnant. I paced a std neck and a 36+ 1.5 head ball.The hip was reduced. Leg lengths were checked fluoroscopically. The hip was dislocated and trial components were removed. I placed the real stem followed by the real spacer and head ball. A single reduction maneuver was performed and the hip was reduced. Fluoroscopy was used to confirm component position and leg lengths. At 90 degrees of external rotation and extension, the hip was stable to an anterior directed force.  The wound was copiously irrigated with normal saline solution. Marcaine solution was injected into the periarticular soft tissue. The wound was closed in layers using #1 Vicryl and V-Loc for the fascia, 2-0 Vicryl for the subcutaneous fat, 2-0 Monocryl for the deep dermal layer, 3-0 running Monocryl subcuticular stitch and glue for the skin. Once the glue was fully dried, an Aquacell Ag dressing was applied. The patient was then awakened from anesthesia and transported to the recovery room in stable condition. Sponge, needle, and instrument counts were correct at the end of the case x2. The patient tolerated the procedure well and there  were no known complications.

## 2016-01-08 NOTE — H&P (Signed)
TRH H&P   Patient Demographics:    Ashmi Blas, is a 82 y.o. female  MRN: [id number]   DOB - [birth year]  Admit Date - 01/07/2016  Outpatient Primary MD for the patient is Mauricia Area, MD  Referring MD/NP/PA:  Nelva Nay   Outpatient Specialists:  Clydie Braun (cardiologist), Curt Bears (neurologist),   Patient coming from:   home  Chief Complaint  Patient presents with  . Fall      HPI:    Vivia Rosenburg  is a 82 y.o. female, with Pafib, chad2svasc=5, Parkinsons,  Presents with c/o fall while at home getting out of chair. Mechanical fall.   Presented to [facility] for evaluation.   In ED,  Found to have R hip fracture.  Pt transported to [facility] for evaluation.  Dr. Victorino Dike is going to proceed with surgery in AM.      Review of systems:    In addition to the HPI above,  No Fever-chills, No Headache, No changes with Vision or hearing, No problems swallowing food or Liquids, No Chest pain, Cough or Shortness of Breath, No Abdominal pain, No Nausea or Vommitting, Bowel movements are regular, No Blood in stool or Urine, No dysuria, No new skin rashes or bruises, No new weakness, tingling, numbness in any extremity, No recent weight gain or loss, No polyuria, polydypsia or polyphagia, No significant Mental Stressors.  A full 10 point Review of Systems was done, except as stated above, all other Review of Systems were negative.   With Past History of the following :    Past Medical History:  Diagnosis Date  . Anemia   . Atrial fibrillation (HCC)   . Back pain   . Cellulitis   . CKD (chronic kidney disease) stage 4, GFR 15-29 ml/min (HCC)   . Diabetes mellitus without complication (HCC)   . DVT (deep venous thrombosis) (HCC)   . Hypertension   . Parkinson disease [facility])       Past Surgical History:  Procedure Laterality Date  .  APPENDECTOMY    . CHOLECYSTECTOMY    . KNEE ARTHROSCOPY    . TONSILLECTOMY        Social History:     Social History  Substance Use Topics  . Smoking status: Never Smoker  . Smokeless tobacco: Never Used  . Alcohol use No     Lives - at home  Mobility - ambulates with assitance prior to injury     Family History :    History reviewed. No pertinent family history.  Negative for cancer or heart disease.     Home Medications:   Prior to Admission medications   Medication Sig Start Date End Date Taking? Authorizing Provider  ALPRAZolam Prudy Feeler) 0.25 MG tablet Take 0.25 mg by mouth at bedtime as needed for anxiety.   Yes Historical Provider, MD  amlodipine-benazepril (LOTREL) 2.5-10  MG capsule Take 1 capsule by mouth daily.   Yes Historical Provider, MD  carbidopa-levodopa (SINEMET IR) 25-100 MG tablet Take 1 tablet by mouth 3 (three) times daily.   Yes Historical Provider, MD  furosemide (LASIX) 20 MG tablet Take 20 mg by mouth.   Yes Historical Provider, MD  gabapentin (NEURONTIN) 100 MG capsule Take 100 mg by mouth 3 (three) times daily.   Yes Historical Provider, MD  HYDROcodone-acetaminophen (NORCO) 10-325 MG tablet Take 1 tablet by mouth every 6 (six) hours as needed.   Yes Historical Provider, MD  losartan (COZAAR) 50 MG tablet Take 50 mg by mouth daily.   Yes Historical Provider, MD  rOPINIRole (REQUIP) 3 MG tablet Take 3 mg by mouth at bedtime.   Yes Historical Provider, MD  sertraline (ZOLOFT) 50 MG tablet Take 50 mg by mouth daily.   Yes Historical Provider, MD  traMADol (ULTRAM) 50 MG tablet Take by mouth every 6 (six) hours as needed.   Yes Historical Provider, MD  warfarin (COUMADIN) 2 MG tablet Take 2 mg by mouth daily.   Yes Historical Provider, MD     Allergies:     Allergies  Allergen Reactions  . Clonidine Derivatives   . Lisinopril   . Macrobid [employer] Macro]   . Sulfa Antibiotics   . Valsartan      Physical Exam:    Vitals  Blood pressure 137/64, pulse 66, temperature 98.4 F (36.9 C), temperature source Oral, resp. rate 20, height 5\' 8"  (1.727 m), weight 77.1 kg (170 lb), SpO2 97 %.   1. General  lying in bed in NAD,    2. Normal affect and insight, Not Suicidal or Homicidal, Awake Alert, Oriented X 3.  3. No F.N deficits, ALL C.Nerves Intact, Strength 5/5 all 4 extremities, Sensation intact all 4 extremities, Plantars down going.  4. Ears and Eyes appear Normal, Conjunctivae clear, PERRLA. Moist Oral Mucosa.  5. Supple Neck, No JVD, No cervical lymphadenopathy appriciated, No Carotid Bruits.  6. Symmetrical Chest wall movement, Good air movement bilaterally, CTAB.  7. RRR, No Gallops, Rubs or Murmurs, No Parasternal Heave.  8. Positive Bowel Sounds, Abdomen Soft, No tenderness, No organomegaly appriciated,No rebound -guarding or rigidity.  9.  No Cyanosis, Normal Skin Turgor, No Skin Rash or Bruise.  10. Good muscle tone,  joints appear normal , no effusions, Normal ROM.  11. No Palpable Lymph Nodes in Neck or Axillae  Skin"  0.5cm ulcer, healed on the dorsum of the right foot.    Data Review:    CBC  Recent Labs Lab 01/07/16 1600  WBC 7.6  HGB 10.4*  HCT 32.9*  PLT 232  MCV 92.9  MCH 29.4  MCHC 31.6  RDW 16.8*  LYMPHSABS 0.3*  MONOABS 0.4  EOSABS 0.3  BASOSABS 0.0   ------------------------------------------------------------------------------------------------------------------  Chemistries   Recent Labs Lab 01/07/16 1600  NA 139  K 3.7  CL 106  CO2 23  GLUCOSE 130*  BUN 36*  CREATININE 1.86*  CALCIUM 8.9   ------------------------------------------------------------------------------------------------------------------ estimated creatinine clearance is 24.6 mL/min (by C-G formula based on SCr of 1.86 mg/dL). ------------------------------------------------------------------------------------------------------------------ No results for input(s): TSH,  T4TOTAL, T3FREE, THYROIDAB in the last 72 hours.  Invalid input(s): FREET3  Coagulation profile  Recent Labs Lab 01/07/16 1600  INR 1.35   ------------------------------------------------------------------------------------------------------------------- No results for input(s): DDIMER in the last 72 hours. -------------------------------------------------------------------------------------------------------------------  Cardiac Enzymes No results for input(s): CKMB, TROPONINI, MYOGLOBIN in the last 168 hours.  Invalid input(s): CK ------------------------------------------------------------------------------------------------------------------  No results found for: BNP   ---------------------------------------------------------------------------------------------------------------  Urinalysis No results found for: COLORURINE, APPEARANCEUR, LABSPEC, PHURINE, GLUCOSEU, HGBUR, BILIRUBINUR, KETONESUR, PROTEINUR, UROBILINOGEN, NITRITE, LEUKOCYTESUR  ----------------------------------------------------------------------------------------------------------------   Imaging Results:    Dg Chest 1 View  Result Date: 01/07/2016 CLINICAL DATA:  Fall today with right elbow pain, initial encounter EXAM: CHEST 1 VIEW COMPARISON:  03/17/2015 FINDINGS: Cardiac shadow is enlarged. Aortic calcifications are again seen and stable. The lungs are well aerated bilaterally. No rib fractures are seen. IMPRESSION: No acute abnormality noted. Electronically Signed   By: Alcide CleverMark  Lukens M.D.   On: 01/07/2016 17:48   Dg Elbow Complete Right  Result Date: 01/07/2016 CLINICAL DATA:  Fall with right elbow pain, initial encounter EXAM: RIGHT ELBOW - COMPLETE 3+ VIEW COMPARISON:  None. FINDINGS: Soft tissue swelling is noted over the proximal ulna. No acute fracture or dislocation is noted. No joint effusion is seen. IMPRESSION: Soft tissue swelling without acute bony abnormality. Electronically Signed   By: Alcide CleverMark   Lukens M.D.   On: 01/07/2016 17:49   Ct Head Wo Contrast  Result Date: 01/07/2016 CLINICAL DATA:  Larey SeatFell from wheelchair. Trauma to the head and neck. Confusion. EXAM: CT HEAD WITHOUT CONTRAST CT CERVICAL SPINE WITHOUT CONTRAST TECHNIQUE: Multidetector CT imaging of the head and cervical spine was performed following the standard protocol without intravenous contrast. Multiplanar CT image reconstructions of the cervical spine were also generated. COMPARISON:  None. 11/30/2014 FINDINGS: CT HEAD FINDINGS There is generalized atrophy. There chronic small-vessel ischemic changes affecting the cerebral hemispheric white matter, thalami I and pons. No sign of acute infarction. No mass lesion, hemorrhage, hydrocephalus or extra-axial collection. No skull fracture. No traumatic fluid in the sinuses, middle ears or mastoids. There is atherosclerotic calcification of the major vessels at the base of the brain. CT CERVICAL SPINE FINDINGS No fracture. No malalignment. No soft tissue swelling. Ordinary chronic spondylosis and facet arthropathy, typical of age. IMPRESSION: Head CT: No acute or traumatic finding. Atrophy and chronic small vessel disease. Cervical spine CT: No acute or traumatic finding. Ordinary degenerative spondylosis. Electronically Signed   By: Paulina FusiMark  Shogry M.D.   On: 01/07/2016 16:51   Ct Cervical Spine Wo Contrast  Result Date: 01/07/2016 CLINICAL DATA:  Larey SeatFell from wheelchair. Trauma to the head and neck. Confusion. EXAM: CT HEAD WITHOUT CONTRAST CT CERVICAL SPINE WITHOUT CONTRAST TECHNIQUE: Multidetector CT imaging of the head and cervical spine was performed following the standard protocol without intravenous contrast. Multiplanar CT image reconstructions of the cervical spine were also generated. COMPARISON:  None. 11/30/2014 FINDINGS: CT HEAD FINDINGS There is generalized atrophy. There chronic small-vessel ischemic changes affecting the cerebral hemispheric white matter, thalami I and pons. No  sign of acute infarction. No mass lesion, hemorrhage, hydrocephalus or extra-axial collection. No skull fracture. No traumatic fluid in the sinuses, middle ears or mastoids. There is atherosclerotic calcification of the major vessels at the base of the brain. CT CERVICAL SPINE FINDINGS No fracture. No malalignment. No soft tissue swelling. Ordinary chronic spondylosis and facet arthropathy, typical of age. IMPRESSION: Head CT: No acute or traumatic finding. Atrophy and chronic small vessel disease. Cervical spine CT: No acute or traumatic finding. Ordinary degenerative spondylosis. Electronically Signed   By: Paulina FusiMark  Shogry M.D.   On: 01/07/2016 16:51   Dg Hip Unilat W Or Wo Pelvis 2-3 Views Left  Result Date: 01/07/2016 CLINICAL DATA:  Fall with hip pain, initial encounter EXAM: DG HIP (WITH OR WITHOUT PELVIS) 2-3V LEFT COMPARISON:  11/30/2014 FINDINGS: There is  a subcapital femoral neck fracture on the right with some impaction and angulation at the fracture site. Degenerative changes of both hip joints are seen. Pelvic ring is intact. No definitive left hip fracture is seen. IMPRESSION: Subcapital right femoral neck fracture. Electronically Signed   By: Alcide CleverMark  Lukens M.D.   On: 01/07/2016 17:50       Assessment & Plan:    Active Problems:   Hip fracture (HCC)    Hip fracture NPO  Hold coumadin Appreciate orthopedic input.  [facility]  Dilaudid, Tramadol for pain control  Pafib (chads2vasc=5) Hold coumadin Benefit of surgery clearly outweights the risk of surgery for the patient at this time.   Parkinsons Sinemet today, now. And then as scheduled.   CKD stage 4 Pt appears to have some chronic kidney disease since 2016 Recommended to her son that she get a nephrologist once discharge.  Avoid nsaids, Avoid IV dye  Anemia, Chronic Check cbc, cmp in am  Dm2 formerly on metformin but presently diet controlled fsbs q4h , iss, check  hga1c   DVT Prophylaxis  SCDs , start on heparin [place] after surgery. No heparin today due to pending surgery  AM Labs Ordered, also please review Full Orders  Family Communication: Admission, patients condition and plan of care including tests being ordered have been discussed with the patient and son Sharl MaMarty who indicate understanding and agree with the plan and Code Status.  Code Status FULL CODE  Likely DC to  SNF  Condition GUARDED    Consults called: orthopedics  Admission status: inpatient  Time spent in minutes : 45 minutes   Pearson GrippeJames Trinitee Horgan M.D on 01/08/2016 at 1:04 AM  Between 7am to 7pm - Pager - [phone]. After 7pm go to [email or web address] - password [email or web address]  [facility] - Office  [phone]

## 2016-01-08 NOTE — Evaluation (Signed)
SLP Cancellation Note  Patient Details Name: Sheila Watkins MRN: [id number] DOB: [birth year]   Cancelled treatment:       Reason Eval/Treat Not Completed: Other (comment) (pt npo for potential surgery today [facility]   Donavan Burnetamara Tangee Marszalek, MS [facility] SLP [phone]

## 2016-01-08 NOTE — Progress Notes (Signed)
VASCULAR LAB PRELIMINARY  PRELIMINARY  PRELIMINARY  PRELIMINARY  Right lower extremity venous duplex completed.    Preliminary report:  Right:  No evidence of DVT, superficial thrombosis, or Baker's cyst.  Milicent Acheampong, RVS 01/08/2016, 11:40 AM

## 2016-01-08 NOTE — Anesthesia Postprocedure Evaluation (Signed)
Anesthesia Post Note  Patient: Sheila Watkins  Procedure(s) Performed: Procedure(s) (LRB): ANTERIOR APPROACH ARTHROPLASTY BIPOLAR HIP (HEMIARTHROPLASTY) (Right)  Patient location during evaluation: PACU Anesthesia Type: General Level of consciousness: awake and alert Pain management: pain level controlled Vital Signs Assessment: post-procedure vital signs reviewed and stable Respiratory status: spontaneous breathing, nonlabored ventilation, respiratory function stable and patient connected to nasal cannula oxygen Cardiovascular status: blood pressure returned to baseline and stable Postop Assessment: no signs of nausea or vomiting Anesthetic complications: no    Last Vitals:  Vitals:   01/08/16 1921 01/08/16 1951  BP: (!) 123/93 (!) 125/54  Pulse: 60 63  Resp: 14 14  Temp: 36.7 C 36.6 C    Last Pain:  Vitals:   01/08/16 1951  TempSrc: Axillary  PainSc:                  Peja Allender,JAMES TERRILL

## 2016-01-08 NOTE — Progress Notes (Addendum)
Pharmacy Antibiotic Note  Sheila Watkins is a 82 y.o. female admitted on 01/07/2016 with cellulitis.  Pharmacy has been consulted for vancomycin dosing.  Per recent note in Care Everywhere, pt was on Cipro PTA without improvement.     Plan: Vancomycin 1000 mg IV x1, then Vancomycin 750 IV every 24 hours.  Goal trough 15-20 mcg/mL.  Height: 5\' 7"  (170.2 cm) Weight: 172 lb 13.5 oz (78.4 kg) IBW/kg (Calculated) : 61.6  Temp (24hrs), Avg:98.2 F (36.8 C), Min:98 F (36.7 C), Max:98.4 F (36.9 C)   Recent Labs Lab 01/07/16 1600 01/08/16 0539  WBC 7.6 10.5  CREATININE 1.86* 1.57*    Estimated Creatinine Clearance: 28.8 mL/min (by C-G formula based on SCr of 1.57 mg/dL).    Allergies  Allergen Reactions  . Clonidine Derivatives     Reaction unknown  . Lisinopril     Reaction unknown  . Macrobid [Nitrofurantoin [employer] Macro] Hives  . Sulfa Antibiotics     Reaction unknown  . Valsartan     Reaction unknown    Antimicrobials this admission: 8/21 vancomycin >>    Dose adjustments this admission: ---  Microbiology results: 8/21 MRSA PCR: negative  Thank you for allowing pharmacy to be a part of this patient's care.  Adalberto ColeNikola Alfonza Toft, PharmD, BCPS Pager [phone] 01/08/2016 8:29 AM

## 2016-01-08 NOTE — Anesthesia Preprocedure Evaluation (Addendum)
Anesthesia Evaluation  Patient identified by MRN, date of birth, ID band Patient awake    Reviewed: Allergy & Precautions, H&P , NPO status , Patient's Chart, lab work & pertinent test results  Airway Mallampati: III  TM Distance: >3 FB Neck ROM: Full    Dental  (+) Edentulous Upper, Edentulous Lower, Dental Advisory Given   Pulmonary    breath sounds clear to auscultation       Cardiovascular hypertension, +CHF  + dysrhythmias (on coumadin, last dose 8/19) Atrial Fibrillation  Rhythm:Irregular Rate:Normal     Neuro/Psych Parkinson's disease    GI/Hepatic   Endo/Other  diabetes, Type 2  Renal/GU CRFRenal disease (Cr 1.57)     Musculoskeletal Back pain   Abdominal   Peds  Hematology  (+) Blood dyscrasia (h/o DVT), anemia ,   Anesthesia Other Findings   Reproductive/Obstetrics                           Anesthesia Physical Anesthesia Plan  ASA: III  Anesthesia Plan: General   Post-op Pain Management:    Induction: Intravenous  Airway Management Planned: Oral ETT  Additional Equipment:   Intra-op Plan:   Post-operative Plan: Extubation in OR  Informed Consent: I have reviewed the patients History and Physical, chart, labs and discussed the procedure including the risks, benefits and alternatives for the proposed anesthesia with the patient or authorized representative who has indicated his/her understanding and acceptance.   Dental advisory given  Plan Discussed with: CRNA  Anesthesia Plan Comments:         Anesthesia Quick Evaluation

## 2016-01-08 NOTE — H&P (View-Only) (Signed)
Reason for Consult: R hip subcapital femoral neck fx  Referring Physician: Jani Gravel, MD   HPI: Sheila Watkins is an 82 year old female, with PMH listed below, that presented to the [place] ED on 01/07/16 with c/o R hip pain.  She reports that she was at home and attempted to ambulate and fell.  She c/o of sharp R hip pain and was unable to bear any weight on her affected extremity.  Nonweightbearing and immobilization makes it feel best.  Radiographs of her R hip/pelvis were obtained and she was diagnosed with a R hip subcapital femoral neck fx.  She was then transferred to [facility] for definitive care.  Dr. Wylene Simmer was then consulted for definitive care.  She denies any previous R hip injury or surgery.  She resides with her son.  He is at the bedside with her this morning.  He states that she has been working with Physical Therapy to regain ambulatory status and strength at home.  She is a household ambulator with a walker.  She is currently NPO and her coumadin is being held.   Surgical Considerations:  On Coumadin for A-fib, HTN, Hx of DVT, Parkinson's disease, CKD, Diet controlled DM II with unknown A1C.  Past Medical History:  Diagnosis Date  . Anemia   . Atrial fibrillation ([place])   . Back pain   . Cellulitis   . CKD (chronic kidney disease) stage 4, GFR 15-29 ml/min (HCC)   . Diabetes mellitus without complication ([place])   . DVT (deep venous thrombosis) ([place])   . Hypertension   . Parkinson disease [facility])     Past Surgical History:  Procedure Laterality Date  . APPENDECTOMY    . CHOLECYSTECTOMY    . KNEE ARTHROSCOPY    . TONSILLECTOMY      History reviewed. No pertinent family history.  Social History:  reports that she has never smoked. She has never used smokeless tobacco. She reports that she does not drink alcohol. Her drug history is not on file.  Allergies:  Allergies  Allergen Reactions  . Clonidine Derivatives     Reaction unknown  .  Lisinopril     Reaction unknown  . Macrobid [Nitrofurantoin [employer]  . Sulfa Antibiotics     Reaction unknown  . Valsartan     Reaction unknown    Medications: I have reviewed the patient's current medications.  Results for orders placed or performed during the hospital encounter of 01/07/16 (from the past 48 hour(s))  CBG monitoring, ED     Status: Abnormal   Collection Time: 01/07/16  3:42 PM  Result Value Ref Range   Glucose-Capillary 128 (H) 65 - 99 mg/dL  Basic metabolic panel     Status: Abnormal   Collection Time: 01/07/16  4:00 PM  Result Value Ref Range   Sodium 139 135 - 145 mmol/L   Potassium 3.7 3.5 - 5.1 mmol/L   Chloride 106 101 - 111 mmol/L   CO2 23 22 - 32 mmol/L   Glucose, Bld 130 (H) 65 - 99 mg/dL   BUN 36 (H) 6 - 20 mg/dL   Creatinine, Ser 1.86 (H) 0.44 - 1.00 mg/dL   Calcium 8.9 8.9 - 10.3 mg/dL   GFR calc non Af Amer 24 (L) >60 mL/min   GFR calc Af Amer 28 (L) >60 mL/min    Comment: (NOTE) The eGFR has been calculated using the CKD EPI equation. This calculation has not been  validated in all clinical situations. eGFR's persistently <60 mL/min signify possible Chronic Kidney Disease.    Anion gap 10 5 - 15  CBC with Differential/Platelet     Status: Abnormal   Collection Time: 01/07/16  4:00 PM  Result Value Ref Range   WBC 7.6 4.0 - 10.5 K/uL   RBC 3.54 (L) 3.87 - 5.11 MIL/uL   Hemoglobin 10.4 (L) 12.0 - 15.0 g/dL   HCT 32.9 (L) 36.0 - 46.0 %   MCV 92.9 78.0 - 100.0 fL   MCH 29.4 26.0 - 34.0 pg   MCHC 31.6 30.0 - 36.0 g/dL   RDW 16.8 (H) 11.5 - 15.5 %   Platelets 232 150 - 400 K/uL   Neutrophils Relative % 85 %   Lymphocytes Relative 4 %   Monocytes Relative 5 %   Eosinophils Relative 4 %   Basophils Relative 0 %   Band Neutrophils 1 %   Metamyelocytes Relative 1 %   Myelocytes 0 %   Promyelocytes Absolute 0 %   Blasts 0 %   nRBC 0 0 /100 WBC   Neutro Abs 6.6 1.7 - 7.7 K/uL   Lymphs Abs 0.3 (L) 0.7 - 4.0 K/uL   Monocytes  Absolute 0.4 0.1 - 1.0 K/uL   Eosinophils Absolute 0.3 0.0 - 0.7 K/uL   Basophils Absolute 0.0 0.0 - 0.1 K/uL   RBC Morphology SCHISTOCYTES NOTED ON SMEAR    WBC Morphology MILD LEFT SHIFT (1-5% METAS, OCC MYELO, OCC BANDS)   Protime-INR     Status: Abnormal   Collection Time: 01/07/16  4:00 PM  Result Value Ref Range   Prothrombin Time 16.8 (H) 11.4 - 15.2 seconds   INR 1.35   CBG monitoring, ED     Status: Abnormal   Collection Time: 01/07/16 10:57 PM  Result Value Ref Range   Glucose-Capillary 153 (H) 65 - 99 mg/dL  MRSA PCR Screening     Status: None   Collection Time: 01/08/16  2:56 AM  Result Value Ref Range   MRSA by PCR NEGATIVE NEGATIVE    Comment:        The GeneXpert MRSA Assay (FDA approved for NASAL specimens only), is one component of a comprehensive MRSA colonization surveillance program. It is not intended to diagnose MRSA infection nor to guide or monitor treatment for MRSA infections.   Glucose, capillary     Status: Abnormal   Collection Time: 01/08/16  3:27 AM  Result Value Ref Range   Glucose-Capillary 140 (H) 65 - 99 mg/dL  Comprehensive metabolic panel     Status: Abnormal   Collection Time: 01/08/16  5:39 AM  Result Value Ref Range   Sodium 141 135 - 145 mmol/L   Potassium 3.8 3.5 - 5.1 mmol/L   Chloride 108 101 - 111 mmol/L   CO2 24 22 - 32 mmol/L   Glucose, Bld 144 (H) 65 - 99 mg/dL   BUN 29 (H) 6 - 20 mg/dL   Creatinine, Ser 1.57 (H) 0.44 - 1.00 mg/dL   Calcium 9.0 8.9 - 10.3 mg/dL   Total Protein 7.3 6.5 - 8.1 g/dL   Albumin 4.2 3.5 - 5.0 g/dL   AST 26 15 - 41 U/L   ALT 12 (L) 14 - 54 U/L   Alkaline Phosphatase 105 38 - 126 U/L   Total Bilirubin 0.8 0.3 - 1.2 mg/dL   GFR calc non Af Amer 29 (L) >60 mL/min   GFR calc Af Amer 34 (L) >60 mL/min  Comment: (NOTE) The eGFR has been calculated using the CKD EPI equation. This calculation has not been validated in all clinical situations. eGFR's persistently <60 mL/min signify possible  Chronic Kidney Disease.    Anion gap 9 5 - 15  CBC     Status: Abnormal   Collection Time: 01/08/16  5:39 AM  Result Value Ref Range   WBC 10.5 4.0 - 10.5 K/uL   RBC 3.78 (L) 3.87 - 5.11 MIL/uL   Hemoglobin 10.9 (L) 12.0 - 15.0 g/dL   HCT 34.9 (L) 36.0 - 46.0 %   MCV 92.3 78.0 - 100.0 fL   MCH 28.8 26.0 - 34.0 pg   MCHC 31.2 30.0 - 36.0 g/dL   RDW 16.7 (H) 11.5 - 15.5 %   Platelets 267 150 - 400 K/uL  Protime-INR     Status: Abnormal   Collection Time: 01/08/16  5:39 AM  Result Value Ref Range   Prothrombin Time 15.6 (H) 11.4 - 15.2 seconds   INR 1.23     Dg Chest 1 View  Result Date: 01/07/2016 CLINICAL DATA:  Fall today with right elbow pain, initial encounter EXAM: CHEST 1 VIEW COMPARISON:  03/17/2015 FINDINGS: Cardiac shadow is enlarged. Aortic calcifications are again seen and stable. The lungs are well aerated bilaterally. No rib fractures are seen. IMPRESSION: No acute abnormality noted. Electronically Signed   By: Inez Catalina M.D.   On: 01/07/2016 17:48   Dg Elbow Complete Right  Result Date: 01/07/2016 CLINICAL DATA:  Fall with right elbow pain, initial encounter EXAM: RIGHT ELBOW - COMPLETE 3+ VIEW COMPARISON:  None. FINDINGS: Soft tissue swelling is noted over the proximal ulna. No acute fracture or dislocation is noted. No joint effusion is seen. IMPRESSION: Soft tissue swelling without acute bony abnormality. Electronically Signed   By: Inez Catalina M.D.   On: 01/07/2016 17:49   Ct Head Wo Contrast  Result Date: 01/07/2016 CLINICAL DATA:  [place] from wheelchair. Trauma to the head and neck. Confusion. EXAM: CT HEAD WITHOUT CONTRAST CT CERVICAL SPINE WITHOUT CONTRAST TECHNIQUE: Multidetector CT imaging of the head and cervical spine was performed following the standard protocol without intravenous contrast. Multiplanar CT image reconstructions of the cervical spine were also generated. COMPARISON:  None. 11/30/2014 FINDINGS: CT HEAD FINDINGS There is generalized atrophy.  There chronic small-vessel ischemic changes affecting the cerebral hemispheric white matter, thalami I and pons. No sign of acute infarction. No mass lesion, hemorrhage, hydrocephalus or extra-axial collection. No skull fracture. No traumatic fluid in the sinuses, middle ears or mastoids. There is atherosclerotic calcification of the major vessels at the base of the brain. CT CERVICAL SPINE FINDINGS No fracture. No malalignment. No soft tissue swelling. Ordinary chronic spondylosis and facet arthropathy, typical of age. IMPRESSION: Head CT: No acute or traumatic finding. Atrophy and chronic small vessel disease. Cervical spine CT: No acute or traumatic finding. Ordinary degenerative spondylosis. Electronically Signed   By: Nelson Chimes M.D.   On: 01/07/2016 16:51   Ct Cervical Spine Wo Contrast  Result Date: 01/07/2016 CLINICAL DATA:  [place] from wheelchair. Trauma to the head and neck. Confusion. EXAM: CT HEAD WITHOUT CONTRAST CT CERVICAL SPINE WITHOUT CONTRAST TECHNIQUE: Multidetector CT imaging of the head and cervical spine was performed following the standard protocol without intravenous contrast. Multiplanar CT image reconstructions of the cervical spine were also generated. COMPARISON:  None. 11/30/2014 FINDINGS: CT HEAD FINDINGS There is generalized atrophy. There chronic small-vessel ischemic changes affecting the cerebral hemispheric white matter, thalami I and  pons. No sign of acute infarction. No mass lesion, hemorrhage, hydrocephalus or extra-axial collection. No skull fracture. No traumatic fluid in the sinuses, middle ears or mastoids. There is atherosclerotic calcification of the major vessels at the base of the brain. CT CERVICAL SPINE FINDINGS No fracture. No malalignment. No soft tissue swelling. Ordinary chronic spondylosis and facet arthropathy, typical of age. IMPRESSION: Head CT: No acute or traumatic finding. Atrophy and chronic small vessel disease. Cervical spine CT: No acute or traumatic  finding. Ordinary degenerative spondylosis. Electronically Signed   By: Nelson Chimes M.D.   On: 01/07/2016 16:51   Dg Hip Unilat W Or Wo Pelvis 2-3 Views Left  Result Date: 01/07/2016 CLINICAL DATA:  Fall with hip pain, initial encounter EXAM: DG HIP (WITH OR WITHOUT PELVIS) 2-3V LEFT COMPARISON:  11/30/2014 FINDINGS: There is a subcapital femoral neck fracture on the right with some impaction and angulation at the fracture site. Degenerative changes of both hip joints are seen. Pelvic ring is intact. No definitive left hip fracture is seen. IMPRESSION: Subcapital right femoral neck fracture. Electronically Signed   By: Inez Catalina M.D.   On: 01/07/2016 17:50    ROS:   Gen: Denies fever, chills, weight change, fatigue, night sweats MSK: C/o of R hip pain with movement.  Generalized difficulty with movement due to Parkinson's. Neuro: Denies headache, numbness, weakness, slurred speech, loss of memory or consciousness Derm: Denies rash, dry skin, scaling or peeling skin change HEENT: Denies blurred vision, double vision, neck stiffness, dysphagia PULM: Denies shortness of breath, cough, sputum production, hemoptysis, wheezing CV: Denies chest pain, edema, orthopnea, palpitations GI: Denies abdominal pain, nausea, vomiting, diarrhea, hematochezia, melena, constipation  Endocrine: Denies hot or cold intolerance, polyuria, polyphagia or appetite change Heme: Denies easy bruising, bleeding, bleeding gums  PE:  Blood pressure (!) 169/59, pulse 65, temperature 98.1 F (36.7 C), temperature source Oral, resp. rate 18, height 5' 7"  (1.702 m), weight 78.4 kg (172 lb 13.5 oz), SpO2 97 %. General: WDWN patient in NAD. Psych:  Appropriate mood and affect. Neuro:  A&O x 3, Moving all extremities, sensation intact to light touch HEENT:  EOMs intact Chest:  Even non-labored respirations Skin:  C/D/I, no rashes or lesions Extremities: warm/dry, no edema, erythmea, or echymosis.  No  lymphadenopathy. Pulses: Popliteus 2+ MSK:  ROM: pain with any hip ROM.  Full ankle ROM, MMT: patient is able to perform quad set with pain, (+) log roll, (-) Homan's   Assessment/Plan: R hip subcapital femoral neck fracture  This is an injury that will not heal and allow for any type of ambulatory status without surgical intervention.  After discussing the risks of bleeding, infection, nerve damage, blood clots, need for additional surgery, amputation, and death the patient and her son have agreed to undergo surgical correction.  Dr. Rod Can will perform R hip hemi arthroplasty at [facility] later this afternoon.  Continue NPO status Continue to hold blood thinners Transfer order to [facility] submitted.  Mechele Claude, PA-C, [employer] Office:  [phone]

## 2016-01-08 NOTE — Anesthesia Procedure Notes (Signed)
Procedure Name: Intubation Date/Time: 01/08/2016 4:34 PM Performed by: Annabelle HarmanSMITH, Clarann Helvey A Pre-anesthesia Checklist: Patient identified, Emergency Drugs available, Suction available and Patient being monitored Patient Re-evaluated:Patient Re-evaluated prior to inductionOxygen Delivery Method: Circle system utilized Preoxygenation: Pre-oxygenation with 100% oxygen Intubation Type: IV induction Ventilation: Mask ventilation without difficulty Laryngoscope Size: 2 and Miller Grade View: Grade II Tube type: Oral Tube size: 7.0 mm Number of attempts: 1 Airway Equipment and Method: Stylet Placement Confirmation: ETT inserted through vocal cords under direct vision,  positive ETCO2 and breath sounds checked- equal and bilateral Secured at: 23 cm Tube secured with: Tape Dental Injury: Teeth and Oropharynx as per pre-operative assessment

## 2016-01-08 NOTE — Progress Notes (Signed)
Attempted to place foley upon arrival to unit. 2 RNs attempted twice, unable to place foley catheter at this time.

## 2016-01-08 NOTE — Consult Note (Signed)
Reason for Consult: R hip subcapital femoral neck fx  Referring Physician: Jani Gravel, MD   HPI: Sheila Watkins is an 82 year old female, with PMH listed below, that presented to the [place] ED on 01/07/16 with c/o R hip pain.  She reports that she was at home and attempted to ambulate and fell.  She c/o of sharp R hip pain and was unable to bear any weight on her affected extremity.  Nonweightbearing and immobilization makes it feel best.  Radiographs of her R hip/pelvis were obtained and she was diagnosed with a R hip subcapital femoral neck fx.  She was then transferred to [facility] for definitive care.  Dr. Wylene Simmer was then consulted for definitive care.  She denies any previous R hip injury or surgery.  She resides with her son.  He is at the bedside with her this morning.  He states that she has been working with Physical Therapy to regain ambulatory status and strength at home.  She is a household ambulator with a walker.  She is currently NPO and her coumadin is being held.   Surgical Considerations:  On Coumadin for A-fib, HTN, Hx of DVT, Parkinson's disease, CKD, Diet controlled DM II with unknown A1C.  Past Medical History:  Diagnosis Date  . Anemia   . Atrial fibrillation ([place])   . Back pain   . Cellulitis   . CKD (chronic kidney disease) stage 4, GFR 15-29 ml/min (HCC)   . Diabetes mellitus without complication ([place])   . DVT (deep venous thrombosis) ([place])   . Hypertension   . Parkinson disease [facility])     Past Surgical History:  Procedure Laterality Date  . APPENDECTOMY    . CHOLECYSTECTOMY    . KNEE ARTHROSCOPY    . TONSILLECTOMY      History reviewed. No pertinent family history.  Social History:  reports that she has never smoked. She has never used smokeless tobacco. She reports that she does not drink alcohol. Her drug history is not on file.  Allergies:  Allergies  Allergen Reactions  . Clonidine Derivatives     Reaction unknown  .  Lisinopril     Reaction unknown  . Macrobid [Nitrofurantoin [employer]  . Sulfa Antibiotics     Reaction unknown  . Valsartan     Reaction unknown    Medications: I have reviewed the patient's current medications.  Results for orders placed or performed during the hospital encounter of 01/07/16 (from the past 48 hour(s))  CBG monitoring, ED     Status: Abnormal   Collection Time: 01/07/16  3:42 PM  Result Value Ref Range   Glucose-Capillary 128 (H) 65 - 99 mg/dL  Basic metabolic panel     Status: Abnormal   Collection Time: 01/07/16  4:00 PM  Result Value Ref Range   Sodium 139 135 - 145 mmol/L   Potassium 3.7 3.5 - 5.1 mmol/L   Chloride 106 101 - 111 mmol/L   CO2 23 22 - 32 mmol/L   Glucose, Bld 130 (H) 65 - 99 mg/dL   BUN 36 (H) 6 - 20 mg/dL   Creatinine, Ser 1.86 (H) 0.44 - 1.00 mg/dL   Calcium 8.9 8.9 - 10.3 mg/dL   GFR calc non Af Amer 24 (L) >60 mL/min   GFR calc Af Amer 28 (L) >60 mL/min    Comment: (NOTE) The eGFR has been calculated using the CKD EPI equation. This calculation has not been  validated in all clinical situations. eGFR's persistently <60 mL/min signify possible Chronic Kidney Disease.    Anion gap 10 5 - 15  CBC with Differential/Platelet     Status: Abnormal   Collection Time: 01/07/16  4:00 PM  Result Value Ref Range   WBC 7.6 4.0 - 10.5 K/uL   RBC 3.54 (L) 3.87 - 5.11 MIL/uL   Hemoglobin 10.4 (L) 12.0 - 15.0 g/dL   HCT 32.9 (L) 36.0 - 46.0 %   MCV 92.9 78.0 - 100.0 fL   MCH 29.4 26.0 - 34.0 pg   MCHC 31.6 30.0 - 36.0 g/dL   RDW 16.8 (H) 11.5 - 15.5 %   Platelets 232 150 - 400 K/uL   Neutrophils Relative % 85 %   Lymphocytes Relative 4 %   Monocytes Relative 5 %   Eosinophils Relative 4 %   Basophils Relative 0 %   Band Neutrophils 1 %   Metamyelocytes Relative 1 %   Myelocytes 0 %   Promyelocytes Absolute 0 %   Blasts 0 %   nRBC 0 0 /100 WBC   Neutro Abs 6.6 1.7 - 7.7 K/uL   Lymphs Abs 0.3 (L) 0.7 - 4.0 K/uL   Monocytes  Absolute 0.4 0.1 - 1.0 K/uL   Eosinophils Absolute 0.3 0.0 - 0.7 K/uL   Basophils Absolute 0.0 0.0 - 0.1 K/uL   RBC Morphology SCHISTOCYTES NOTED ON SMEAR    WBC Morphology MILD LEFT SHIFT (1-5% METAS, OCC MYELO, OCC BANDS)   Protime-INR     Status: Abnormal   Collection Time: 01/07/16  4:00 PM  Result Value Ref Range   Prothrombin Time 16.8 (H) 11.4 - 15.2 seconds   INR 1.35   CBG monitoring, ED     Status: Abnormal   Collection Time: 01/07/16 10:57 PM  Result Value Ref Range   Glucose-Capillary 153 (H) 65 - 99 mg/dL  MRSA PCR Screening     Status: None   Collection Time: 01/08/16  2:56 AM  Result Value Ref Range   MRSA by PCR NEGATIVE NEGATIVE    Comment:        The GeneXpert MRSA Assay (FDA approved for NASAL specimens only), is one component of a comprehensive MRSA colonization surveillance program. It is not intended to diagnose MRSA infection nor to guide or monitor treatment for MRSA infections.   Glucose, capillary     Status: Abnormal   Collection Time: 01/08/16  3:27 AM  Result Value Ref Range   Glucose-Capillary 140 (H) 65 - 99 mg/dL  Comprehensive metabolic panel     Status: Abnormal   Collection Time: 01/08/16  5:39 AM  Result Value Ref Range   Sodium 141 135 - 145 mmol/L   Potassium 3.8 3.5 - 5.1 mmol/L   Chloride 108 101 - 111 mmol/L   CO2 24 22 - 32 mmol/L   Glucose, Bld 144 (H) 65 - 99 mg/dL   BUN 29 (H) 6 - 20 mg/dL   Creatinine, Ser 1.57 (H) 0.44 - 1.00 mg/dL   Calcium 9.0 8.9 - 10.3 mg/dL   Total Protein 7.3 6.5 - 8.1 g/dL   Albumin 4.2 3.5 - 5.0 g/dL   AST 26 15 - 41 U/L   ALT 12 (L) 14 - 54 U/L   Alkaline Phosphatase 105 38 - 126 U/L   Total Bilirubin 0.8 0.3 - 1.2 mg/dL   GFR calc non Af Amer 29 (L) >60 mL/min   GFR calc Af Amer 34 (L) >60 mL/min  Comment: (NOTE) The eGFR has been calculated using the CKD EPI equation. This calculation has not been validated in all clinical situations. eGFR's persistently <60 mL/min signify possible  Chronic Kidney Disease.    Anion gap 9 5 - 15  CBC     Status: Abnormal   Collection Time: 01/08/16  5:39 AM  Result Value Ref Range   WBC 10.5 4.0 - 10.5 K/uL   RBC 3.78 (L) 3.87 - 5.11 MIL/uL   Hemoglobin 10.9 (L) 12.0 - 15.0 g/dL   HCT 34.9 (L) 36.0 - 46.0 %   MCV 92.3 78.0 - 100.0 fL   MCH 28.8 26.0 - 34.0 pg   MCHC 31.2 30.0 - 36.0 g/dL   RDW 16.7 (H) 11.5 - 15.5 %   Platelets 267 150 - 400 K/uL  Protime-INR     Status: Abnormal   Collection Time: 01/08/16  5:39 AM  Result Value Ref Range   Prothrombin Time 15.6 (H) 11.4 - 15.2 seconds   INR 1.23     Dg Chest 1 View  Result Date: 01/07/2016 CLINICAL DATA:  Fall today with right elbow pain, initial encounter EXAM: CHEST 1 VIEW COMPARISON:  03/17/2015 FINDINGS: Cardiac shadow is enlarged. Aortic calcifications are again seen and stable. The lungs are well aerated bilaterally. No rib fractures are seen. IMPRESSION: No acute abnormality noted. Electronically Signed   By: Inez Catalina M.D.   On: 01/07/2016 17:48   Dg Elbow Complete Right  Result Date: 01/07/2016 CLINICAL DATA:  Fall with right elbow pain, initial encounter EXAM: RIGHT ELBOW - COMPLETE 3+ VIEW COMPARISON:  None. FINDINGS: Soft tissue swelling is noted over the proximal ulna. No acute fracture or dislocation is noted. No joint effusion is seen. IMPRESSION: Soft tissue swelling without acute bony abnormality. Electronically Signed   By: Inez Catalina M.D.   On: 01/07/2016 17:49   Ct Head Wo Contrast  Result Date: 01/07/2016 CLINICAL DATA:  [place] from wheelchair. Trauma to the head and neck. Confusion. EXAM: CT HEAD WITHOUT CONTRAST CT CERVICAL SPINE WITHOUT CONTRAST TECHNIQUE: Multidetector CT imaging of the head and cervical spine was performed following the standard protocol without intravenous contrast. Multiplanar CT image reconstructions of the cervical spine were also generated. COMPARISON:  None. 11/30/2014 FINDINGS: CT HEAD FINDINGS There is generalized atrophy.  There chronic small-vessel ischemic changes affecting the cerebral hemispheric white matter, thalami I and pons. No sign of acute infarction. No mass lesion, hemorrhage, hydrocephalus or extra-axial collection. No skull fracture. No traumatic fluid in the sinuses, middle ears or mastoids. There is atherosclerotic calcification of the major vessels at the base of the brain. CT CERVICAL SPINE FINDINGS No fracture. No malalignment. No soft tissue swelling. Ordinary chronic spondylosis and facet arthropathy, typical of age. IMPRESSION: Head CT: No acute or traumatic finding. Atrophy and chronic small vessel disease. Cervical spine CT: No acute or traumatic finding. Ordinary degenerative spondylosis. Electronically Signed   By: Nelson Chimes M.D.   On: 01/07/2016 16:51   Ct Cervical Spine Wo Contrast  Result Date: 01/07/2016 CLINICAL DATA:  [place] from wheelchair. Trauma to the head and neck. Confusion. EXAM: CT HEAD WITHOUT CONTRAST CT CERVICAL SPINE WITHOUT CONTRAST TECHNIQUE: Multidetector CT imaging of the head and cervical spine was performed following the standard protocol without intravenous contrast. Multiplanar CT image reconstructions of the cervical spine were also generated. COMPARISON:  None. 11/30/2014 FINDINGS: CT HEAD FINDINGS There is generalized atrophy. There chronic small-vessel ischemic changes affecting the cerebral hemispheric white matter, thalami I and  pons. No sign of acute infarction. No mass lesion, hemorrhage, hydrocephalus or extra-axial collection. No skull fracture. No traumatic fluid in the sinuses, middle ears or mastoids. There is atherosclerotic calcification of the major vessels at the base of the brain. CT CERVICAL SPINE FINDINGS No fracture. No malalignment. No soft tissue swelling. Ordinary chronic spondylosis and facet arthropathy, typical of age. IMPRESSION: Head CT: No acute or traumatic finding. Atrophy and chronic small vessel disease. Cervical spine CT: No acute or traumatic  finding. Ordinary degenerative spondylosis. Electronically Signed   By: Nelson Chimes M.D.   On: 01/07/2016 16:51   Dg Hip Unilat W Or Wo Pelvis 2-3 Views Left  Result Date: 01/07/2016 CLINICAL DATA:  Fall with hip pain, initial encounter EXAM: DG HIP (WITH OR WITHOUT PELVIS) 2-3V LEFT COMPARISON:  11/30/2014 FINDINGS: There is a subcapital femoral neck fracture on the right with some impaction and angulation at the fracture site. Degenerative changes of both hip joints are seen. Pelvic ring is intact. No definitive left hip fracture is seen. IMPRESSION: Subcapital right femoral neck fracture. Electronically Signed   By: Inez Catalina M.D.   On: 01/07/2016 17:50    ROS:   Gen: Denies fever, chills, weight change, fatigue, night sweats MSK: C/o of R hip pain with movement.  Generalized difficulty with movement due to Parkinson's. Neuro: Denies headache, numbness, weakness, slurred speech, loss of memory or consciousness Derm: Denies rash, dry skin, scaling or peeling skin change HEENT: Denies blurred vision, double vision, neck stiffness, dysphagia PULM: Denies shortness of breath, cough, sputum production, hemoptysis, wheezing CV: Denies chest pain, edema, orthopnea, palpitations GI: Denies abdominal pain, nausea, vomiting, diarrhea, hematochezia, melena, constipation  Endocrine: Denies hot or cold intolerance, polyuria, polyphagia or appetite change Heme: Denies easy bruising, bleeding, bleeding gums  PE:  Blood pressure (!) 169/59, pulse 65, temperature 98.1 F (36.7 C), temperature source Oral, resp. rate 18, height 5' 7"  (1.702 m), weight 78.4 kg (172 lb 13.5 oz), SpO2 97 %. General: WDWN patient in NAD. Psych:  Appropriate mood and affect. Neuro:  A&O x 3, Moving all extremities, sensation intact to light touch HEENT:  EOMs intact Chest:  Even non-labored respirations Skin:  C/D/I, no rashes or lesions Extremities: warm/dry, no edema, erythmea, or echymosis.  No  lymphadenopathy. Pulses: Popliteus 2+ MSK:  ROM: pain with any hip ROM.  Full ankle ROM, MMT: patient is able to perform quad set with pain, (+) log roll, (-) Homan's   Assessment/Plan: R hip subcapital femoral neck fracture  This is an injury that will not heal and allow for any type of ambulatory status without surgical intervention.  After discussing the risks of bleeding, infection, nerve damage, blood clots, need for additional surgery, amputation, and death the patient and her son have agreed to undergo surgical correction.  Dr. Rod Can will perform R hip hemi arthroplasty at [facility] later this afternoon.  Continue NPO status Continue to hold blood thinners Transfer order to [facility] submitted.  Mechele Claude, PA-C, [employer] Office:  [phone]

## 2016-01-09 ENCOUNTER — Inpatient Hospital Stay (HOSPITAL_COMMUNITY): Payer: Medicare Other

## 2016-01-09 ENCOUNTER — Encounter (HOSPITAL_COMMUNITY): Payer: Self-pay | Admitting: *Deleted

## 2016-01-09 DIAGNOSIS — D638 Anemia in other chronic diseases classified elsewhere: Secondary | ICD-10-CM

## 2016-01-09 DIAGNOSIS — G20 Parkinson's disease: Secondary | ICD-10-CM

## 2016-01-09 DIAGNOSIS — N184 Chronic kidney disease, stage 4 (severe): Secondary | ICD-10-CM

## 2016-01-09 LAB — BASIC METABOLIC PANEL
ANION GAP: 6 (ref 5–15)
BUN: 25 mg/dL — ABNORMAL HIGH (ref 6–20)
CALCIUM: 8.2 mg/dL — AB (ref 8.9–10.3)
CO2: 24 mmol/L (ref 22–32)
CREATININE: 1.98 mg/dL — AB (ref 0.44–1.00)
Chloride: 108 mmol/L (ref 101–111)
GFR, EST AFRICAN AMERICAN: 26 mL/min — AB (ref >60)
GFR, EST NON AFRICAN AMERICAN: 22 mL/min — AB (ref >60)
Glucose, Bld: 135 mg/dL — ABNORMAL HIGH (ref 65–99)
Potassium: 3.9 mmol/L (ref 3.5–5.1)
SODIUM: 138 mmol/L (ref 135–145)

## 2016-01-09 LAB — PROTIME-INR
INR: 1.5
PROTHROMBIN TIME: 18.3 s — AB (ref 11.4–15.2)

## 2016-01-09 LAB — HEMOGLOBIN A1C
Hgb A1c MFr Bld: 6.4 % — ABNORMAL HIGH (ref 4.8–5.6)
Mean Plasma Glucose: 137 mg/dL

## 2016-01-09 LAB — CBC
HEMATOCRIT: 28.5 % — AB (ref 36.0–46.0)
Hemoglobin: 8.6 g/dL — ABNORMAL LOW (ref 12.0–15.0)
MCH: 28.1 pg (ref 26.0–34.0)
MCHC: 30.2 g/dL (ref 30.0–36.0)
MCV: 93.1 fL (ref 78.0–100.0)
Platelets: 204 10*3/uL (ref 150–400)
RBC: 3.06 MIL/uL — ABNORMAL LOW (ref 3.87–5.11)
RDW: 16.7 % — AB (ref 11.5–15.5)
WBC: 9.1 10*3/uL (ref 4.0–10.5)

## 2016-01-09 MED ORDER — VANCOMYCIN HCL 500 MG IV SOLR: 500.0000 mg | INTRAVENOUS | Status: DC

## 2016-01-09 MED ORDER — CEPHALEXIN 250 MG/5ML PO SUSR
250.0000 mg | Freq: Three times a day (TID) | ORAL | Status: DC
  Administered 2016-01-09 – 2016-01-13 (×12): 250 mg via ORAL
  Filled 2016-01-09 (×17): qty 5

## 2016-01-09 MED ORDER — SODIUM CHLORIDE 0.9 % IV BOLUS (SEPSIS)
500.0000 mL | Freq: Once | INTRAVENOUS | Status: AC
  Administered 2016-01-09: 500 mL via INTRAVENOUS

## 2016-01-09 MED ORDER — WARFARIN SODIUM 1 MG PO TABS
1.0000 mg | ORAL_TABLET | Freq: Once | ORAL | Status: DC
  Filled 2016-01-09: qty 1

## 2016-01-09 NOTE — Progress Notes (Signed)
ANTICOAGULATION CONSULT NOTE   Pharmacy Consult for Coumadin Indication: atrial fibrillation and VTE prophylaxis post-op hip surgery  Allergies  Allergen Reactions  . Clonidine Derivatives     Reaction unknown  . Lisinopril     Reaction unknown  . Macrobid [Nitrofurantoin [employer]] Hives  . Sulfa Antibiotics     Reaction unknown  . Valsartan     Reaction unknown    Patient Measurements: Height: 5\' 7"  (170.2 cm) Weight: 172 lb (78 kg) IBW/kg (Calculated) : 61.6  Vital Signs: Temp: 98.1 F (36.7 C) (08/22 0512) Temp Source: Oral (08/22 0512) BP: 94/59 (08/22 0512) Pulse Rate: 67 (08/22 0512)  Labs:  Recent Labs  01/07/16 1600 01/08/16 0539 01/08/16 2050 01/09/16 0516  HGB 10.4* 10.9* 10.4* 8.6*  HCT 32.9* 34.9* 34.5* 28.5*  PLT 232 267 223 204  LABPROT 16.8* 15.6*  --  18.3*  INR 1.35 1.23  --  1.50  CREATININE 1.86* 1.57* 1.64* 1.98*    Estimated Creatinine Clearance: 22.8 mL/min (by C-G formula based on SCr of 1.98 mg/dL).  Assessment:  82 yr old female continues on coumadin for history of afib and remote DVT. INR remains subtherapeutic as expected. No bleeding noted.   Home Coumadin regimen: 4 mg Sun, Wed and Fridays; 1 mg Mon, Tues, Thurs and Saturdays.  Goal of Therapy:  INR 2-3 Monitor platelets by anticoagulation protocol: Yes   Plan:  Warfarin 1mg  PO x 1 tonight (attempting to get patient back on her home regimen) Daily INR DC lovenox when INR is therapeutic  Lysle Pearlachel Qusai Kem, PharmD, BCPS Pager # [phone] 01/09/2016 1:00 PM

## 2016-01-09 NOTE — Progress Notes (Signed)
Pharmacy Antibiotic Note  Sheila MemsRuth L Watkins is a 82 y.o. female admitted on 01/07/2016 with cellulitis.  Pharmacy has been consulted for vancomycin dosing. Pt is afebrile and WBC is WNL. Scr has been trending up steadily, will need to watch closely.   Plan: Change vancomycin to 500mg  IV Q24 F/u renal fxn, C&S, clinical status and trough at SS   Height: 5\' 7"  (170.2 cm) Weight: 172 lb (78 kg) IBW/kg (Calculated) : 61.6  Temp (24hrs), Avg:98 F (36.7 C), Min:97.8 F (36.6 C), Max:98.1 F (36.7 C)   Recent Labs Lab 01/07/16 1600 01/08/16 0539 01/08/16 2050 01/09/16 0516  WBC 7.6 10.5 11.8* 9.1  CREATININE 1.86* 1.57* 1.64* 1.98*    Estimated Creatinine Clearance: 22.8 mL/min (by C-G formula based on SCr of 1.98 mg/dL).    Allergies  Allergen Reactions  . Clonidine Derivatives     Reaction unknown  . Lisinopril     Reaction unknown  . Macrobid [Nitrofurantoin [employer]] Hives  . Sulfa Antibiotics     Reaction unknown  . Valsartan     Reaction unknown    Antimicrobials this admission: 8/21 vancomycin >>   Dose adjustments this admission: 8/22 Dose adjusted to 500mg  Q24H d/t worsening renal fxn  Microbiology results: 8/21 MRSA PCR: negative  Thank you for allowing pharmacy to be a part of this patient's care.  Lysle Pearlachel Avonda Toso, PharmD, BCPS Pager # [phone] 01/09/2016 1:02 PM

## 2016-01-09 NOTE — Progress Notes (Signed)
   Subjective:  Patient reports pain as mild to moderate.  No c/o.  Objective:   VITALS:   Vitals:   01/08/16 1913 01/08/16 1921 01/08/16 1951 01/09/16 0512  BP: (!) 146/89 (!) 123/93 (!) 125/54 (!) 94/59  Pulse: 68 60 63 67  Resp:  14 14 16   Temp:  98 F (36.7 C) 97.8 F (36.6 C) 98.1 F (36.7 C)  TempSrc:   Axillary Oral  SpO2: 97% 96% 98% 90%  Weight:      Height:        ABD soft Sensation intact distally Intact pulses distally Dorsiflexion/Plantar flexion intact Incision: dressing C/D/I Compartment soft R ankle cellulitis stable   Lab Results  Component Value Date   WBC 9.1 01/09/2016   HGB 8.6 (L) 01/09/2016   HCT 28.5 (L) 01/09/2016   MCV 93.1 01/09/2016   PLT 204 01/09/2016   BMET    Component Value Date/Time   NA 138 01/09/2016 0516   K 3.9 01/09/2016 0516   CL 108 01/09/2016 0516   CO2 24 01/09/2016 0516   GLUCOSE 135 (H) 01/09/2016 0516   BUN 25 (H) 01/09/2016 0516   CREATININE 1.98 (H) 01/09/2016 0516   CALCIUM 8.2 (L) 01/09/2016 0516   GFRNONAA 22 (L) 01/09/2016 0516   GFRAA 26 (L) 01/09/2016 0516     Assessment/Plan: 1 Day Post-Op   Active Problems:   Hip fracture, right (HCC)   Chronic atrial fibrillation (HCC)   Femoral neck fracture (HCC)   Femoral neck fracture, right, closed, initial encounter   WBAT with walker DVT ppx: coumadin with lovenox bridge Cont IV abx for cellulitis (present preop) PT/OT Dispo: home with HHPT, please discuss d/c plan with Chong SicilianJill Lauer, [facility]    Malinda Mayden, Cloyde ReamsBrian James 01/09/2016, 7:47 AM   Samson FredericBrian Mathias Bogacki, MD Cell [phone]

## 2016-01-09 NOTE — [place] FL2 (Signed)
[place] MEDICAID FL2 LEVEL OF CARE SCREENING TOOL     IDENTIFICATION  Patient Name: Sheila MemsRuth L Algeo Birthdate: Dec 18, 1929 Sex: female Admission Date (Current Location): 01/07/2016  [facility] [place]   Facility and Address:  [facility], [address], [place], [place] [address]      Provider Number: [id number]  Attending Physician Name and Address:  Ozella Rocksavid J Merrell, MD  Relative Name and Phone Number:       Current Level of Care: Hospital Recommended Level of Care: Skilled Nursing Facility Prior Approval Number:    Date Approved/Denied:   PASRR Number:  [id number])  Discharge Plan: SNF    Current Diagnoses: Patient Active Problem List   Diagnosis Date Noted  . Anemia of chronic disease   . Chronic kidney disease (CKD), stage IV (severe) (HCC)   . Parkinson disease (HCC)   . Femoral neck fracture (HCC) 01/08/2016  . Femoral neck fracture, right, closed, initial encounter 01/08/2016  . Chronic atrial fibrillation (HCC)   . Hip fracture, right (HCC) 01/07/2016    Orientation RESPIRATION BLADDER Height & Weight     Self, Time, Place  Normal Incontinent Weight: 172 lb (78 kg) Height:  5\' 7"  (170.2 cm)  BEHAVIORAL SYMPTOMS/MOOD NEUROLOGICAL BOWEL NUTRITION STATUS      Incontinent    AMBULATORY STATUS COMMUNICATION OF NEEDS Skin   Supervision Verbally Normal                       Personal Care Assistance Level of Assistance  Bathing           Functional Limitations Info             SPECIAL CARE FACTORS FREQUENCY                       Contractures      Additional Factors Info                  Current Medications (01/09/2016):  This is the current hospital active medication list Current Facility-Administered Medications  Medication Dose Route Frequency Provider Last Rate Last Dose  . 0.9 %  sodium chloride infusion   Intravenous Continuous Samson FredericBrian Swinteck, MD 75 mL/hr at 01/09/16 1600     . acetaminophen (TYLENOL) tablet 650 mg  650 mg Oral Q6H PRN Samson FredericBrian Swinteck, MD       Or  . acetaminophen (TYLENOL) suppository 650 mg  650 mg Rectal Q6H PRN Samson FredericBrian Swinteck, MD      . ALPRAZolam Prudy Feeler(XANAX) tablet 0.25 mg  0.25 mg Oral QHS PRN Pearson GrippeJames Kim, MD      . carbidopa-levodopa (SINEMET IR) 25-100 MG per tablet immediate release 1 tablet  1 tablet Oral TID Pearson GrippeJames Kim, MD   1 tablet at 01/09/16 1546  . cephALEXin (KEFLEX) 250 MG/5ML suspension 250 mg  250 mg Oral Q8H Ozella Rocksavid J Merrell, MD      . docusate sodium (COLACE) capsule 100 mg  100 mg Oral BID Samson FredericBrian Swinteck, MD   100 mg at 01/09/16 0958  . enoxaparin (LOVENOX) injection 30 mg  30 mg Subcutaneous Q24H Samson FredericBrian Swinteck, MD   30 mg at 01/09/16 0820  . ferrous sulfate tablet 325 mg  325 mg Oral TID PC Rolly SalterPranav M Patel, MD   325 mg at 01/09/16 1332  . gabapentin (NEURONTIN) capsule 100 mg  100 mg Oral TID Pearson GrippeJames Kim, MD   100 mg at 01/09/16  [id number]  . HYDROcodone-acetaminophen (NORCO/VICODIN) 5-325 MG per tablet 1-2 tablet  1-2 tablet Oral Q6H PRN Samson FredericBrian Swinteck, MD   1 tablet at 01/09/16 0958  . HYDROmorphone (DILAUDID) injection 0.5 mg  0.5 mg Intravenous Q4H PRN Pearson GrippeJames Kim, MD   0.5 mg at 01/08/16 1504  . menthol-cetylpyridinium (CEPACOL) lozenge 3 mg  1 lozenge Oral PRN Samson FredericBrian Swinteck, MD       Or  . phenol (CHLORASEPTIC) mouth spray 1 spray  1 spray Mouth/Throat PRN Samson FredericBrian Swinteck, MD      . metoCLOPramide (REGLAN) tablet 5 mg  5 mg Oral Q8H PRN Samson FredericBrian Swinteck, MD       Or  . metoCLOPramide (REGLAN) injection 5 mg  5 mg Intravenous Q8H PRN Samson FredericBrian Swinteck, MD      . morphine 2 MG/ML injection 0.5 mg  0.5 mg Intravenous Q2H PRN Samson FredericBrian Swinteck, MD      . ondansetron [facility](ZOFRAN) tablet 4 mg  4 mg Oral Q6H PRN Samson FredericBrian Swinteck, MD       Or  . ondansetron [facility](ZOFRAN) injection 4 mg  4 mg Intravenous Q6H PRN Samson FredericBrian Swinteck, MD      . rOPINIRole (REQUIP) tablet 3 mg  3 mg Oral QHS Pearson GrippeJames Kim, MD   3 mg at 01/08/16 2111  . senna (SENOKOT) tablet 8.6 mg  1  tablet Oral BID Samson FredericBrian Swinteck, MD   8.6 mg at 01/09/16 [id number]  . sertraline (ZOLOFT) tablet 50 mg  50 mg Oral Daily Pearson GrippeJames Kim, MD   50 mg at 01/09/16 0958  . traMADol (ULTRAM) tablet 50 mg  50 mg Oral Q6H PRN Pearson GrippeJames Kim, MD   50 mg at 01/08/16 0901  . warfarin (COUMADIN) tablet 1 mg  1 mg Oral ONCE-1800 Rachel L Rumbarger, RPH      . Warfarin - Pharmacist Dosing Inpatient   Does not apply q1800 Scarlett Prestoheresa D Egan, [facility] CenterRPH         Discharge Medications: Please see discharge summary for a list of discharge medications.  Relevant Imaging Results:  Relevant Lab Results:   Additional Information  (SS: [id number])  [name], Sonny Anthes R

## 2016-01-09 NOTE — Evaluation (Signed)
Clinical/Bedside Swallow Evaluation Patient Details  Name: Sheila Watkins MRN: [id number] Date of Birth: [birth year]  Today's Date: 01/09/2016 Time: SLP Start Time (ACUTE ONLY): 0955 SLP Stop Time (ACUTE ONLY): 1019 SLP Time Calculation (min) (ACUTE ONLY): 24 min  Past Medical History:  Past Medical History:  Diagnosis Date  . Anemia   . Atrial fibrillation (HCC)   . Back pain   . Cellulitis   . CKD (chronic kidney disease) stage 4, GFR 15-29 ml/min (HCC)   . Diabetes mellitus without complication (HCC)   . DVT (deep venous thrombosis) (HCC)   . Hypertension   . Parkinson disease [facility](HCC)    Past Surgical History:  Past Surgical History:  Procedure Laterality Date  . APPENDECTOMY    . CHOLECYSTECTOMY    . HIP ARTHROPLASTY Right 01/08/2016   Procedure: ANTERIOR APPROACH ARTHROPLASTY BIPOLAR HIP (HEMIARTHROPLASTY);  Surgeon: Samson FredericBrian Swinteck, MD;  [facility];  Service: Orthopedics;  Laterality: Right;  . KNEE ARTHROSCOPY    . TONSILLECTOMY     HPI:  Sheila Watkins is an 82 year old female, with PMH listed below, that presented to the [facility] ED on 01/07/16 with c/o R hip pain.  She reports that she was at home and attempted to ambulate and fell.  She c/o of sharp R hip pain and was unable to bear any weight on her affected extremity.  Nonweightbearing and immobilization makes it feel best.  Radiographs of her R hip/pelvis were obtained and she was diagnosed with a R hip subcapital femoral neck fx.  [facility].  Dr. Toni ArthursJohn Hewitt was then consulted for definitive care.  She denies any previous R hip injury or surgery.  She resides with her son.  He is at the bedside with her this morning.  He states that she has been working with Physical Therapy to regain ambulatory status and strength at home.  She is a household ambulator with a walker.  She is currently NPO and her coumadin is being held.   Assessment / Plan /  Recommendation Clinical Impression  Pt seen for clinical assessment of swallow function following surgery for hip fx. PMH is significant for Parkinsons with mild dysphagia at baseline. Pt endorses intermittent difficulty with "things going the wrong way." No complaints of any specific foods being problematic. Pt tolerated all consistencies trialed this date [facility]. Straw was beneficial with liquids to provide some stabilization. No significant lingual pumping noted. Required liquid wash after meds whole in applesauce to achieve oral clearance. Recommend: Continue with regular diet with pt's son to order appropriate foods for pt. Add extra moisture to trays, particularly for meats. Meds whole with applesauce. Liquid wash as needed to clear oral cavity. Will follow.     Aspiration Risk  Mild aspiration risk    Diet Recommendation Regular;Thin liquid   Liquid Administration via: Straw Medication Administration: Whole meds with puree (liquid wash as needed to clear oral cavity) Supervision: Full supervision/cueing for compensatory strategies;Staff to assist with self feeding Compensations: Small sips/bites;Follow solids with liquid Postural Changes: Seated upright at 90 degrees;Remain upright for at least 30 minutes after po intake    Other  Recommendations Oral Care Recommendations: Oral care BID   Follow up Recommendations  24 hour supervision/assistance    Frequency and Duration min 1 x/week  1 week       Prognosis Prognosis for Safe Diet Advancement: Good Barriers to Reach Goals:  (underlying medical condition)  Swallow Study   General Date of Onset: 01/07/16 HPI: Sheila Watkins is an 82 year old female, with PMH listed below, that presented to the [facility] ED on 01/07/16 with c/o R hip pain.  She reports that she was at home and attempted to ambulate and fell.  She c/o of sharp R hip pain and was unable to bear any weight on her affected extremity.  Nonweightbearing and  immobilization makes it feel best.  Radiographs of her R hip/pelvis were obtained and she was diagnosed with a R hip subcapital femoral neck fx.  [facility].  Dr. Toni ArthursJohn Hewitt was then consulted for definitive care.  She denies any previous R hip injury or surgery.  She resides with her son.  He is at the bedside with her this morning.  He states that she has been working with Physical Therapy to regain ambulatory status and strength at home.  She is a household ambulator with a walker.  She is currently NPO and her coumadin is being held. Type of Study: Bedside Swallow Evaluation Previous Swallow Assessment: none known Diet Prior to this Study: Regular;Thin liquids Temperature Spikes Noted: No Respiratory Status: Room air History of Recent Intubation:  (for surgery) Behavior/Cognition: Alert;Cooperative;Pleasant mood;Requires cueing Oral Cavity Assessment: Within Functional Limits Oral Cavity - Dentition: Adequate natural dentition Self-Feeding Abilities: Needs assist;Needs set up Patient Positioning: Upright in bed Baseline Vocal Quality: Low vocal intensity Volitional Cough: Weak Volitional Swallow: Able to elicit    Oral/Motor/Sensory Function Overall Oral Motor/Sensory Function: Mild impairment Facial ROM: Within Functional Limits Facial Symmetry: Within Functional Limits Facial Strength: Within Functional Limits Facial Sensation: Within Functional Limits Lingual ROM:  (reduced lateralization) Lingual Symmetry: Within Functional Limits Lingual Strength: Reduced Mandible: Within Functional Limits   Ice Chips Ice chips: Not tested   Thin Liquid Thin Liquid: Impaired Presentation: Straw Pharyngeal  Phase Impairments: Suspected delayed Swallow    Nectar Thick Nectar Thick Liquid: Not tested   Honey Thick Honey Thick Liquid: Not tested   Puree Puree: Within functional limits Presentation: Spoon   Solid   GO   Solid:  Impaired Oral Phase Functional Implications: Impaired mastication        Rocky CraftsKara E Analena Gama MA, CCC-SLP Pager [phone] 01/09/2016,10:35 AM

## 2016-01-09 NOTE — Progress Notes (Addendum)
PROGRESS NOTE    Sheila Watkins  GEX:[id number] DOB: Mar 14, 1930 DOA: 01/07/2016 PCP: Mauricia AreaSELTZER, BARRY R, MD    Brief Narrative:  Sheila Watkins  is a 82 y.o. female, with Pafib, chad2svasc=5, Parkinsons,  Presents with c/o fall while at home getting out of chair. Mechanical fall.   [facility]. In ED,  Found to have R hip fracture.  [facility]  Dr. Victorino DikeHewitt to see for hip fracture repair    Assessment & Plan:   Active Problems:   Hip fracture, right (HCC)   Chronic atrial fibrillation (HCC)   Femoral neck fracture (HCC)   Femoral neck fracture, right, closed, initial encounter   Displaced right femoral neck fracture: Repaired on 01/08/2015. POD#1.  - Management per orthopedics - Resume anticoagulations per orthopedics  Anemia: Normocytic. Suspect from renal dysfunction. Baseline 10.5, per our lab results and review of care everywhere. Drop to 8.6 postoperatively. A symptomatically. Repeat differential without evidence of schistocytes. - CBC in a.m - Consider transfusion if drops below 8 or become symptomatic. - Anemia panel .  Parkinson's: Advanced. - Continue Sinemet - PT - Social work for placement after hip fracture and difficulty family continuing to care for patient with advanced Parkinson's. - Continue Neurontin  C KD: Creatinine upturning to 1.98. Suspect this is due to postoperative changes and anemia. - IVF - BMP in a.m.  Chronic atrial fibrillation and WNU:[id number]:CHA2DS2-VASc = 6. - Resume anticoagulation per orthopedics  Depression: - Continue Zoloft, Requip  Right lower extremity cellulitis: Marked improvement since admission. - DC IV vancomycin. 01/08/2016>>01/09/2016 - Start Keflex. 01/09/16 >>  Hypotension: per review of care everywhere BP appears to run 100-110. No antihypertensive medications. Bp becoming more soft. And requiring more O2 - CXR - Echo - continue O2 - IVF  DVT prophylaxis:  coumadin Code Status: FULL Family Communication: SOn/caregiver Disposition Plan: Pending improvement after hip fracture repair. - likely SNF placement  Consultants:   ortho   Procedures:   R fem fracture repair 01/08/16  Antimicrobials:  Vancomycin 8/21>>8/22  Subjective: Doing better. Pain well controlled. Appetite returning. No palpitations, SOB, CP, fevers, loc, vertigo/dizziness.   Objective: Vitals:   01/08/16 1921 01/08/16 1951 01/09/16 0512 01/09/16 1449  BP: (!) 123/93 (!) 125/54 (!) 94/59 (!) 96/46  Pulse: 60 63 67 72  Resp: 14 14 16 16   Temp: 98 F (36.7 C) 97.8 F (36.6 C) 98.1 F (36.7 C) 98.6 F (37 C)  TempSrc:  Axillary Oral Axillary  SpO2: 96% 98% 90% 91%  Weight:      Height:        Intake/Output Summary (Last 24 hours) at 01/09/16 1612 Last data filed at 01/09/16 0700  Gross per 24 hour  Intake             1410 ml  Output              925 ml  Net              485 ml   Filed Weights   01/08/16 0050 01/08/16 0332 01/08/16 1544  Weight: 78.4 kg (172 lb 13.5 oz) 78.4 kg (172 lb 13.5 oz) 78 kg (172 lb)    Examination:  General exam: Appears calm and comfortable  Respiratory system: Clear to auscultation. Respiratory effort normal. Cardiovascular system: S1 & S2 heard, RRR. No JVD, murmurs, rubs, gallops or clicks. No pedal edema. Gastrointestinal system: Abdomen is nondistended, soft and nontender. No organomegaly  or masses felt. Normal bowel sounds heard. Central nervous system: Alert and oriented. No focal neurological deficits. Extremities: Symmetric 5 x 5 power. Skin: No rashes, lesions or ulcers Psychiatry: Judgement and insight appear normal. Mood & affect appropriate.     Data Reviewed: I have personally reviewed following labs and imaging studies  CBC:  Recent Labs Lab 01/07/16 1600 01/08/16 0539 01/08/16 2050 01/09/16 0516  WBC 7.6 10.5 11.8* 9.1  NEUTROABS 6.6 9.6*  --   --   HGB 10.4* 10.9* 10.4* 8.6*  HCT 32.9* 34.9*  34.5* 28.5*  MCV 92.9 92.3 93.0 93.1  PLT 232 267 223 204   Basic Metabolic Panel:  Recent Labs Lab 01/07/16 1600 01/08/16 0539 01/08/16 2050 01/09/16 0516  NA 139 141  --  138  K 3.7 3.8  --  3.9  CL 106 108  --  108  CO2 23 24  --  24  GLUCOSE 130* 144*  --  135*  BUN 36* 29*  --  25*  CREATININE 1.86* 1.57* 1.64* 1.98*  CALCIUM 8.9 9.0  --  8.2*   GFR: Estimated Creatinine Clearance: 22.8 mL/min (by C-G formula based on SCr of 1.98 mg/dL). Liver Function Tests:  Recent Labs Lab 01/08/16 0539  AST 26  ALT 12*  ALKPHOS 105  BILITOT 0.8  PROT 7.3  ALBUMIN 4.2   No results for input(s): LIPASE, AMYLASE in the last 168 hours. No results for input(s): AMMONIA in the last 168 hours. Coagulation Profile:  Recent Labs Lab 01/07/16 1600 01/08/16 0539 01/09/16 0516  INR 1.35 1.23 1.50   Cardiac Enzymes: No results for input(s): CKTOTAL, CKMB, CKMBINDEX, TROPONINI in the last 168 hours. BNP (last 3 results) No results for input(s): PROBNP in the last 8760 hours. HbA1C:  Recent Labs  01/08/16 0539  HGBA1C 6.4*   CBG:  Recent Labs Lab 01/07/16 1542 01/07/16 2257 01/08/16 0327 01/08/16 0736 01/08/16 1844  GLUCAP 128* 153* 140* 136* 121*   Lipid Profile: No results for input(s): CHOL, HDL, LDLCALC, TRIG, CHOLHDL, LDLDIRECT in the last 72 hours. Thyroid Function Tests: No results for input(s): TSH, T4TOTAL, FREET4, T3FREE, THYROIDAB in the last 72 hours. Anemia Panel: No results for input(s): VITAMINB12, FOLATE, FERRITIN, TIBC, IRON, RETICCTPCT in the last 72 hours. Sepsis Labs: No results for input(s): PROCALCITON, LATICACIDVEN in the last 168 hours.  Recent Results (from the past 240 hour(s))  MRSA PCR Screening     Status: None   Collection Time: 01/08/16  2:56 AM  Result Value Ref Range Status   MRSA by PCR NEGATIVE NEGATIVE Final    Comment:        The GeneXpert MRSA Assay (FDA approved for NASAL specimens only), is one component of  a comprehensive MRSA colonization surveillance program. It is not intended to diagnose MRSA infection nor to guide or monitor treatment for MRSA infections.   Culture, blood (routine x 2)     Status: None (Preliminary result)   Collection Time: 01/08/16  9:20 AM  Result Value Ref Range Status   Specimen Description BLOOD LEFT HAND  Final   Special Requests   Final    IN PEDIATRIC BOTTLE 1 CC Performed at [facility]    Culture PENDING  Incomplete   Report Status PENDING  Incomplete         Radiology Studies: Dg Chest 1 View  Result Date: 01/07/2016 CLINICAL DATA:  Fall today with right elbow pain, initial encounter EXAM: CHEST 1 VIEW COMPARISON:  03/17/2015  FINDINGS: Cardiac shadow is enlarged. Aortic calcifications are again seen and stable. The lungs are well aerated bilaterally. No rib fractures are seen. IMPRESSION: No acute abnormality noted. Electronically Signed   By: Alcide CleverMark  Lukens M.D.   On: 01/07/2016 17:48   Dg Elbow Complete Right  Result Date: 01/07/2016 CLINICAL DATA:  Fall with right elbow pain, initial encounter EXAM: RIGHT ELBOW - COMPLETE 3+ VIEW COMPARISON:  None. FINDINGS: Soft tissue swelling is noted over the proximal ulna. No acute fracture or dislocation is noted. No joint effusion is seen. IMPRESSION: Soft tissue swelling without acute bony abnormality. Electronically Signed   By: Alcide CleverMark  Lukens M.D.   On: 01/07/2016 17:49   Ct Head Wo Contrast  Result Date: 01/07/2016 CLINICAL DATA:  Larey SeatFell from wheelchair. Trauma to the head and neck. Confusion. EXAM: CT HEAD WITHOUT CONTRAST CT CERVICAL SPINE WITHOUT CONTRAST TECHNIQUE: Multidetector CT imaging of the head and cervical spine was performed following the standard protocol without intravenous contrast. Multiplanar CT image reconstructions of the cervical spine were also generated. COMPARISON:  None. 11/30/2014 FINDINGS: CT HEAD FINDINGS There is generalized atrophy. There chronic small-vessel ischemic  changes affecting the cerebral hemispheric white matter, thalami I and pons. No sign of acute infarction. No mass lesion, hemorrhage, hydrocephalus or extra-axial collection. No skull fracture. No traumatic fluid in the sinuses, middle ears or mastoids. There is atherosclerotic calcification of the major vessels at the base of the brain. CT CERVICAL SPINE FINDINGS No fracture. No malalignment. No soft tissue swelling. Ordinary chronic spondylosis and facet arthropathy, typical of age. IMPRESSION: Head CT: No acute or traumatic finding. Atrophy and chronic small vessel disease. Cervical spine CT: No acute or traumatic finding. Ordinary degenerative spondylosis. Electronically Signed   By: Paulina FusiMark  Shogry M.D.   On: 01/07/2016 16:51   Ct Cervical Spine Wo Contrast  Result Date: 01/07/2016 CLINICAL DATA:  Larey SeatFell from wheelchair. Trauma to the head and neck. Confusion. EXAM: CT HEAD WITHOUT CONTRAST CT CERVICAL SPINE WITHOUT CONTRAST TECHNIQUE: Multidetector CT imaging of the head and cervical spine was performed following the standard protocol without intravenous contrast. Multiplanar CT image reconstructions of the cervical spine were also generated. COMPARISON:  None. 11/30/2014 FINDINGS: CT HEAD FINDINGS There is generalized atrophy. There chronic small-vessel ischemic changes affecting the cerebral hemispheric white matter, thalami I and pons. No sign of acute infarction. No mass lesion, hemorrhage, hydrocephalus or extra-axial collection. No skull fracture. No traumatic fluid in the sinuses, middle ears or mastoids. There is atherosclerotic calcification of the major vessels at the base of the brain. CT CERVICAL SPINE FINDINGS No fracture. No malalignment. No soft tissue swelling. Ordinary chronic spondylosis and facet arthropathy, typical of age. IMPRESSION: Head CT: No acute or traumatic finding. Atrophy and chronic small vessel disease. Cervical spine CT: No acute or traumatic finding. Ordinary degenerative  spondylosis. Electronically Signed   By: Paulina FusiMark  Shogry M.D.   On: 01/07/2016 16:51   Pelvis Portable  Result Date: 01/08/2016 CLINICAL DATA:  Postop film for right hip arthroplasty. EXAM: PORTABLE PELVIS 1-2 VIEWS COMPARISON:  None. FINDINGS: Status post right hip arthroplasty. Hardware appears intact and appropriately positioned. No evidence of surgical complicating feature. Expected postsurgical changes within the overlying soft tissues. Adjacent osseous pelvis appears intact and normally aligned. Degenerative change noted in the lower lumbar spine. IMPRESSION: Expected postsurgical changes status post right hip arthroplasty. Hardware appears appropriately positioned. No evidence of surgical complicating feature. Electronically Signed   By: Bary RichardStan  Maynard M.D.   On: 01/08/2016 19:00  Dg C-arm 1-60 Min  Result Date: 01/08/2016 CLINICAL DATA:  Right anterior total hip arthroplasty. EXAM: OPERATIVE RIGHT HIP (WITH PELVIS IF PERFORMED)  VIEWS TECHNIQUE: Fluoroscopic spot image(s) were submitted for interpretation post-operatively. COMPARISON:  None. FINDINGS: Two intraoperative fluoroscopic images are provided showing placement of right hip arthroplasty hardware. Hardware appears appropriately positioned. No evidence of surgical complicating feature. Fluoroscopy provided for 18 seconds. IMPRESSION: Intraoperative fluoroscopic images from a total hip arthroplasty. No evidence of surgical complicating feature on these limited images. Fluoroscopy provided for 18 seconds. Electronically Signed   By: Bary Richard M.D.   On: 01/08/2016 18:09   Dg Hip Operative Unilat W Or W/o Pelvis Right  Result Date: 01/08/2016 CLINICAL DATA:  Right anterior total hip arthroplasty. EXAM: OPERATIVE RIGHT HIP (WITH PELVIS IF PERFORMED)  VIEWS TECHNIQUE: Fluoroscopic spot image(s) were submitted for interpretation post-operatively. COMPARISON:  None. FINDINGS: Two intraoperative fluoroscopic images are provided showing placement  of right hip arthroplasty hardware. Hardware appears appropriately positioned. No evidence of surgical complicating feature. Fluoroscopy provided for 18 seconds. IMPRESSION: Intraoperative fluoroscopic images from a total hip arthroplasty. No evidence of surgical complicating feature on these limited images. Fluoroscopy provided for 18 seconds. Electronically Signed   By: Bary Richard M.D.   On: 01/08/2016 18:09   Dg Hip Unilat W Or Wo Pelvis 2-3 Views Left  Result Date: 01/07/2016 CLINICAL DATA:  Fall with hip pain, initial encounter EXAM: DG HIP (WITH OR WITHOUT PELVIS) 2-3V LEFT COMPARISON:  11/30/2014 FINDINGS: There is a subcapital femoral neck fracture on the right with some impaction and angulation at the fracture site. Degenerative changes of both hip joints are seen. Pelvic ring is intact. No definitive left hip fracture is seen. IMPRESSION: Subcapital right femoral neck fracture. Electronically Signed   By: Alcide Clever M.D.   On: 01/07/2016 17:50        Scheduled Meds: . carbidopa-levodopa  1 tablet Oral TID  . docusate sodium  100 mg Oral BID  . enoxaparin (LOVENOX) injection  30 mg Subcutaneous Q24H  . ferrous sulfate  325 mg Oral TID PC  . gabapentin  100 mg Oral TID  . rOPINIRole  3 mg Oral QHS  . senna  1 tablet Oral BID  . sertraline  50 mg Oral Daily  . [START ON 01/10/2016] vancomycin  500 mg Intravenous Q24H  . warfarin  1 mg Oral ONCE-1800  . Warfarin - Pharmacist Dosing Inpatient   Does not apply q1800   Continuous Infusions: . sodium chloride 75 mL/hr at 01/09/16 1600     LOS: 2 days        Reinhard Schack, Elmon Else, MD [facility]   If 7PM-7AM, please contact night-coverage [email or web address] Password [facility] 01/09/2016, 4:12 PM

## 2016-01-09 NOTE — Evaluation (Signed)
Occupational Therapy Evaluation Patient Details Name: Aundria MemsRuth L Hang MRN: [id number] DOB: June 29, 1929 Today's Date: 01/09/2016    History of Present Illness Patient is a 82 y/o female with hx of PD, HTN, DVT, DM, CKD and A-fib presents s/p right hip hemiarthoplasty after fall at home.   Clinical Impression   At baseline, pt is able to self feed with occasional assistance. She is dependent in bathing, dressing and toileting. Pt presents with generalized weakness, impaired cognition, intermittent lethargy and poor balance. Son is hopeful that pt can return home with one person assist for transfers. Recommending SNF so pt may receive maximum therapy to facilitate return home. Son is in agreement.    Follow Up Recommendations  SNF;Supervision/Assistance - 24 hour    Equipment Recommendations  None recommended by OT    Recommendations for Other Services       Precautions / Restrictions Precautions Precautions: Fall Precaution Comments: direct anterior approach Restrictions Weight Bearing Restrictions: Yes RLE Weight Bearing: Weight bearing as tolerated      Mobility Bed Mobility      General bed mobility comments: pt in chair  Transfers Overall transfer level: Needs assistance Equipment used: 2 person hand held assist Transfers: Sit to/from Stand Sit to Stand: Max assist;+2 physical assistance Stand pivot transfers: Mod assist;+2 physical assistance       General transfer comment: from recliner    Balance Overall balance assessment: Needs assistance;History of Falls Sitting-balance support: Feet supported;No upper extremity supported Sitting balance-Leahy Scale: Poor Sitting balance - Comments: R side lean in chair   Standing balance support: During functional activity;Bilateral upper extremity supported Standing balance-Leahy Scale: Poor                             ADL Overall ADL's : At baseline                                        General ADL Comments: pt fidgety, taking gown off, ordered busy apron for pt and instructed son in use and benefits     Vision     Perception     Praxis      Pertinent Vitals/Pain Pain Assessment: Faces Faces Pain Scale: Hurts little more Pain Location: R hip Pain Descriptors / Indicators: Guarding Pain Intervention(s): Monitored during session;Repositioned     Hand Dominance Right   Extremity/Trunk Assessment Upper Extremity Assessment Upper Extremity Assessment: Generalized weakness   Lower Extremity Assessment Lower Extremity Assessment: Defer to PT evaluation       Communication Communication Communication: Expressive difficulties (minimal verbalization)   Cognition Arousal/Alertness: Awake/alert;Lethargic (periods of lethargy, son reports is baseline) Behavior During Therapy: Flat affect Overall Cognitive Status: Impaired/Different from baseline Area of Impairment: Orientation;Following commands;Attention;Problem solving Orientation Level: Disoriented to;Time;Situation Current Attention Level: Focused Memory: Decreased short-term memory Following Commands: Follows one step commands with increased time;Follows one step commands inconsistently     Problem Solving: Slow processing;Decreased initiation;Difficulty sequencing;Requires verbal cues;Requires tactile cues     General Comments       Exercises       Shoulder Instructions      Home Living Family/patient expects to be discharged to:: Private residence Living Arrangements: Children (son) Available Help at Discharge: Family;Available PRN/intermittently;Personal care attendant Type of Home: House Home Access: Level entry     Home Layout: One level     Bathroom  Shower/Tub: Producer, television/film/videoWalk-in shower   Bathroom Toilet: Handicapped height Bathroom Accessibility: Yes   Home Equipment: Environmental consultantWalker - 2 wheels;Electric scooter;Wheelchair - [employer] Corporationmanual;Bedside commode;Shower seat;Grab bars - tub/shower;Grab bars -  toilet          Prior Functioning/Environment Level of Independence: Needs assistance  Gait / Transfers Assistance Needed: Pt doing OPPT for back. Walking short distances with RW and assist. Assist with transfers at baseline per son. Mostly uses scooter for mobility but sometimes manual w/c. ADL's / Homemaking Assistance Needed: pt self feeds sometimes, total assist for ADL otherwise        OT Diagnosis: Generalized weakness;Acute pain;Cognitive deficits   OT Problem List:     OT Treatment/Interventions:      OT Goals(Current goals can be found in the care plan section) Acute Rehab OT Goals Patient Stated Goal: son wants to get pt home if possible  OT Frequency:     Barriers to D/C:            Co-evaluation              End of Session Equipment Utilized During Treatment: Gait belt  Activity Tolerance: Patient tolerated treatment well Patient left: in chair;with call bell/phone within reach;with family/visitor present   Time: 1347-1419 OT Time Calculation (min): 32 min Charges:  OT General Charges $OT Visit: 1 Procedure OT Evaluation $OT Eval Moderate Complexity: 1 Procedure OT Treatments $Therapeutic Activity: 8-22 mins G-Codes:    Evern BioMayberry, Ercie Eliasen Lynn 01/09/2016, 2:38 PM [phone]

## 2016-01-09 NOTE — Progress Notes (Signed)
Patient complains of left shoulder pain. Slight swelling between the neck and the left should. House coverage notified through Amnion. Left shoulder DG portable 1 view ordered. Report passed to Night shift nurse to page [phone] with the results of Test.

## 2016-01-09 NOTE — Evaluation (Signed)
Physical Therapy Evaluation Patient Details Name: Sheila MemsRuth L Watkins MRN: [id number] DOB: [birth year] Today's Date: 01/09/2016   History of Present Illness  Patient is a 82 y/o female with hx of PD, HTN, DVT, DM, CKD and A-fib presents s/p right hip hemiarthoplasty after fall at home.  Clinical Impression  Patient presents with pain, lethargy, decreased initiation, flat affect and post surgical deficits RLE s/p Rt THA, direct anterior approach. Pt requires increased time to perform all mobility due to hx of PD and decreased initiation. Pt with focused attention during session with moments of sustained attention but difficult to stay engaged due to lethargy. Pt's son present and reports pt requires assist for ADLs and transfers at baseline. Pt doing OPPT and does minimal ambulation at baseline- mainly using scooter. Pt has caregivers most of the time at home and he helps care for her as well but works. Discussed possible need for short term SNF but son really wants to get pt home if at all possible. Will follow acutely to maximize independence, mobilty and ease burden of care prior to return home.    Follow Up Recommendations SNF    Equipment Recommendations  None recommended by PT    Recommendations for Other Services OT consult     Precautions / Restrictions Precautions Precautions: Fall Precaution Comments: direct anterior approach Restrictions Weight Bearing Restrictions: Yes RLE Weight Bearing: Weight bearing as tolerated      Mobility  Bed Mobility Overal bed mobility: Needs Assistance Bed Mobility: Supine to Sit     Supine to sit: Max assist;HOB elevated     General bed mobility comments: Assist to bring BLEs to EOB, scoot bottom and elevate trunk. Decreased initiation despite cues. Increased time to perform all mobility.   Transfers Overall transfer level: Needs assistance Equipment used: Rolling walker (2 wheeled);None Transfers: Sit to/from [employer] CorporationStand;Stand Pivot  Transfers Sit to Stand: Max assist;+2 physical assistance Stand pivot transfers: Mod assist;+2 physical assistance       General transfer comment: Assist to stand from EOB with cues for hand placement/foot placement and forward momentum. Decreased initiation to stand. Son assisted witht echnique at home of standing in front of pt to initate forward momentum and lean. Increased knee flexion in BLEs upon standing and manual cues for hip extension and upright. SPT with assist of 2 to pivot feet to chair.  Ambulation/Gait                Stairs            Wheelchair Mobility    Modified Rankin (Stroke Patients Only)       Balance Overall balance assessment: Needs assistance;History of Falls Sitting-balance support: Feet supported;No upper extremity supported Sitting balance-Leahy Scale: Fair Sitting balance - Comments: left lateral lean sitting EOB.    Standing balance support: During functional activity;Bilateral upper extremity supported Standing balance-Leahy Scale: Poor Standing balance comment: Reliant on BUEs and external support for balance in standing. Not able to obtain full upright standing.                             Pertinent Vitals/Pain Pain Assessment: Faces Faces Pain Scale: Hurts even more Pain Location: hip Pain Intervention(s): RN gave pain meds during session    Home Living Family/patient expects to be discharged to:: Private residence Living Arrangements: Children Available Help at Discharge: Family;Available PRN/intermittently;Personal care attendant Type of Home: House Home Access: Level entry     Home Layout:  One level Home Equipment: Walker - 2 wheels;Electric scooter;Wheelchair - [employer] Corporationmanual;Bedside commode;Shower seat;Grab bars - tub/shower;Grab bars - toilet      Prior Function Level of Independence: Needs assistance   Gait / Transfers Assistance Needed: Pt doing OPPT for back. Walking short distances with RW and assist.  Assist with transfers at baseline per son. Mostly uses scooter for mobility but sometimes manual w/c.  ADL's / Homemaking Assistance Needed: total A for ADLs.        Hand Dominance        Extremity/Trunk Assessment   Upper Extremity Assessment: Defer to OT evaluation           Lower Extremity Assessment: Generalized weakness         Communication   Communication:  (low toned)  Cognition Arousal/Alertness: Lethargic;Suspect due to medications (given meds prior to PT arrival. ) Behavior During Therapy: Flat affect Overall Cognitive Status: Impaired/Different from baseline Area of Impairment: Orientation;Following commands;Attention;Problem solving Orientation Level: Disoriented to;Time Current Attention Level: Focused Memory: Decreased short-term memory Following Commands: Follows one step commands with increased time     Problem Solving: Slow processing;Decreased initiation;Difficulty sequencing;Requires verbal cues;Requires tactile cues      General Comments      Exercises        Assessment/Plan    PT Assessment Patient needs continued PT services  PT Diagnosis Difficulty walking;Acute pain;Altered mental status   PT Problem List Decreased strength;Decreased mobility;Decreased safety awareness;Decreased activity tolerance;Decreased cognition;Pain;Decreased balance;Decreased knowledge of use of DME  PT Treatment Interventions Gait training;DME instruction;Therapeutic activities;Therapeutic exercise;Patient/family education;Balance training;Wheelchair mobility training;Functional mobility training   PT Goals (Current goals can be found in the Care Plan section) Acute Rehab PT Goals Patient Stated Goal: son wants to get pt home if possible PT Goal Formulation: With patient/family Time For Goal Achievement: 01/23/16 Potential to Achieve Goals: Fair    Frequency Min 5X/week   Barriers to discharge Decreased caregiver support Son working on getting around  the clock care if needed    Co-evaluation               End of Session Equipment Utilized During Treatment: Gait belt Activity Tolerance: Patient limited by lethargy Patient left: in chair;with call bell/phone within reach Nurse Communication: Mobility status;Other (comment) (transfer technique and assist with feeding(supervision for meals))         Time: 3086-57841113-1148 PT Time Calculation (min) (ACUTE ONLY): 35 min   Charges:   PT Evaluation $PT Eval Moderate Complexity: 1 Procedure PT Treatments $Therapeutic Activity: 8-22 mins   PT G Codes:        Virna Livengood A Lajoya Dombek 01/09/2016, 12:28 PM  Mylo RedShauna Tessica Cupo, PT, DPT [phone]

## 2016-01-10 ENCOUNTER — Inpatient Hospital Stay (HOSPITAL_COMMUNITY): Payer: Medicare Other

## 2016-01-10 DIAGNOSIS — J80 Acute respiratory distress syndrome: Secondary | ICD-10-CM

## 2016-01-10 DIAGNOSIS — N184 Chronic kidney disease, stage 4 (severe): Secondary | ICD-10-CM

## 2016-01-10 DIAGNOSIS — G20 Parkinson's disease: Secondary | ICD-10-CM

## 2016-01-10 DIAGNOSIS — S72001D Fracture of unspecified part of neck of right femur, subsequent encounter for closed fracture with routine healing: Secondary | ICD-10-CM

## 2016-01-10 DIAGNOSIS — D638 Anemia in other chronic diseases classified elsewhere: Secondary | ICD-10-CM

## 2016-01-10 LAB — IRON AND TIBC
Iron: 13 ug/dL — ABNORMAL LOW (ref 28–170)
SATURATION RATIOS: 6 % — AB (ref 10.4–31.8)
TIBC: 200 ug/dL — ABNORMAL LOW (ref 250–450)
UIBC: 187 ug/dL

## 2016-01-10 LAB — BASIC METABOLIC PANEL
ANION GAP: 6 (ref 5–15)
BUN: 32 mg/dL — ABNORMAL HIGH (ref 6–20)
CO2: 22 mmol/L (ref 22–32)
Calcium: 8 mg/dL — ABNORMAL LOW (ref 8.9–10.3)
Chloride: 109 mmol/L (ref 101–111)
Creatinine, Ser: 2.3 mg/dL — ABNORMAL HIGH (ref 0.44–1.00)
GFR calc Af Amer: 21 mL/min — ABNORMAL LOW (ref >60)
GFR, EST NON AFRICAN AMERICAN: 18 mL/min — AB (ref >60)
GLUCOSE: 131 mg/dL — AB (ref 65–99)
POTASSIUM: 3.9 mmol/L (ref 3.5–5.1)
Sodium: 137 mmol/L (ref 135–145)

## 2016-01-10 LAB — ECHOCARDIOGRAM COMPLETE
Height: 67 in
Weight: 2913.6 oz

## 2016-01-10 LAB — CBC
HEMATOCRIT: 28.6 % — AB (ref 36.0–46.0)
HEMOGLOBIN: 8.6 g/dL — AB (ref 12.0–15.0)
MCH: 28.2 pg (ref 26.0–34.0)
MCHC: 30.1 g/dL (ref 30.0–36.0)
MCV: 93.8 fL (ref 78.0–100.0)
PLATELETS: 181 10*3/uL (ref 150–400)
RBC: 3.05 MIL/uL — AB (ref 3.87–5.11)
RDW: 16.8 % — ABNORMAL HIGH (ref 11.5–15.5)
WBC: 8.7 10*3/uL (ref 4.0–10.5)

## 2016-01-10 LAB — RETICULOCYTES
RBC.: 3.05 MIL/uL — AB (ref 3.87–5.11)
RETIC COUNT ABSOLUTE: 79.3 10*3/uL (ref 19.0–186.0)
Retic Ct Pct: 2.6 % (ref 0.4–3.1)

## 2016-01-10 LAB — PROTIME-INR
INR: 2.35
Prothrombin Time: 26.2 seconds — ABNORMAL HIGH (ref 11.4–15.2)

## 2016-01-10 LAB — GLUCOSE, CAPILLARY
Glucose-Capillary: 166 mg/dL — ABNORMAL HIGH (ref 65–99)
Glucose-Capillary: 130 mg/dL — ABNORMAL HIGH (ref 65–99)
Glucose-Capillary: 120 mg/dL — ABNORMAL HIGH (ref 65–99)

## 2016-01-10 LAB — FERRITIN: Ferritin: 146 ng/mL (ref 11–307)

## 2016-01-10 LAB — VITAMIN B12: VITAMIN B 12: 227 pg/mL (ref 180–914)

## 2016-01-10 LAB — FOLATE: FOLATE: 17.8 ng/mL (ref >5.9)

## 2016-01-10 MED ORDER — HYDROCODONE-ACETAMINOPHEN 5-325 MG PO TABS
1.0000 | ORAL_TABLET | Freq: Four times a day (QID) | ORAL | Status: DC | PRN
  Administered 2016-01-10 – 2016-01-12 (×7): 1 via ORAL
  Filled 2016-01-10 (×7): qty 1

## 2016-01-10 MED ORDER — ROPINIROLE HCL 1 MG PO TABS
6.0000 mg | ORAL_TABLET | Freq: Every day | ORAL | Status: DC
  Administered 2016-01-10: 6 mg via ORAL
  Filled 2016-01-10: qty 6

## 2016-01-10 MED ORDER — CARBIDOPA-LEVODOPA 25-100 MG PO TABS
1.0000 | ORAL_TABLET | Freq: Three times a day (TID) | ORAL | Status: DC
  Administered 2016-01-10 – 2016-01-11 (×3): 1 via ORAL
  Filled 2016-01-10 (×3): qty 1

## 2016-01-10 MED ORDER — ROPINIROLE HCL 1 MG PO TABS: 6.0000 mg | ORAL_TABLET | Freq: Every day | ORAL | Status: DC

## 2016-01-10 MED ORDER — SODIUM CHLORIDE 0.9 % IV SOLN
INTRAVENOUS | Status: DC
  Administered 2016-01-10: 14:00:00 via INTRAVENOUS

## 2016-01-10 MED ORDER — VITAMIN B-12 1000 MCG PO TABS
1000.0000 ug | ORAL_TABLET | Freq: Every day | ORAL | Status: DC
  Administered 2016-01-10 – 2016-01-13 (×4): 1000 ug via ORAL
  Filled 2016-01-10 (×5): qty 1

## 2016-01-10 MED ORDER — SODIUM CHLORIDE 0.9 % IV SOLN
510.0000 mg | Freq: Once | INTRAVENOUS | Status: AC
  Administered 2016-01-10: 510 mg via INTRAVENOUS
  Filled 2016-01-10: qty 17

## 2016-01-10 MED ORDER — ROPINIROLE HCL 1 MG PO TABS
3.0000 mg | ORAL_TABLET | Freq: Two times a day (BID) | ORAL | Status: DC
  Administered 2016-01-10 – 2016-01-11 (×3): 3 mg via ORAL
  Filled 2016-01-10 (×2): qty 3

## 2016-01-10 MED ORDER — GABAPENTIN 300 MG PO CAPS
300.0000 mg | ORAL_CAPSULE | Freq: Every day | ORAL | Status: DC
  Administered 2016-01-11 – 2016-01-12 (×2): 300 mg via ORAL
  Filled 2016-01-10 (×2): qty 1

## 2016-01-10 MED ORDER — SODIUM CHLORIDE 0.9 % IV SOLN
INTRAVENOUS | Status: DC
  Administered 2016-01-10: 19:00:00 via INTRAVENOUS

## 2016-01-10 NOTE — Clinical Social Work Placement (Signed)
   CLINICAL SOCIAL WORK PLACEMENT  NOTE  Date:  01/10/2016  Patient Details  Name: Sheila Watkins MRN: [id number] Date of Birth: February 06, 1930  Clinical Social Work is seeking post-discharge placement for this patient at the Skilled  Nursing Facility level of care (*CSW will initial, date and re-position this form in  chart as items are completed):  Yes   Patient/family provided with [place] Clinical Social Work Department's list of facilities offering this level of care within the geographic area requested by the patient (or if unable, by the patient's family).  Yes   Patient/family informed of their freedom to choose among providers that offer the needed level of care, that participate in Medicare, Medicaid or managed care program needed by the patient, have an available bed and are willing to accept the patient.  Yes   Patient/family informed of [facility]'s ownership interest [facility] and [facility], as well as of the fact that they are under no obligation to receive care at these facilities.  PASRR submitted to EDS on       PASRR number received on       Existing PASRR number confirmed on       FL2 transmitted to all facilities in geographic area requested by pt/family on       FL2 transmitted to all facilities within larger geographic area on       Patient informed that his/her managed care company has contracts with or will negotiate with certain facilities, including the following:        Yes (Son has choosen [facility] for patient. Son was informed of other bed offers. )   Patient/family informed of bed offers received.  Patient chooses bed at       Physician recommends and patient chooses bed at      Patient to be transferred to   on  .  Patient to be transferred to facility by       Patient family notified on   of transfer.  Name of family member notified:        PHYSICIAN       Additional Comment:     _______________________________________________ [name], [name] 01/10/2016, 3:58 PM

## 2016-01-10 NOTE — Progress Notes (Signed)
PT Cancellation Note  Patient Details Name: Sheila Watkins MRN: [id number] DOB: [birth year]   Cancelled Treatment:    Reason Eval/Treat Not Completed: Patient at procedure or test/unavailable.  Pt currently off the floor for procedure.  Will f/u another time.     Marinell Igarashi, Alison MurrayMegan F 01/10/2016, 11:14 AM

## 2016-01-10 NOTE — Care Management Important Message (Signed)
Important Message  Patient Details  Name: Aundria MemsRuth L Pavlock MRN: [id number] Date of Birth: 06-18-1929   Medicare Important Message Given:  Yes    Leone Mobley Stefan ChurchBratton 01/10/2016, 11:46 AM

## 2016-01-10 NOTE — Progress Notes (Signed)
Physical Therapy Treatment Patient Details Name: Sheila MemsRuth L Watkins MRN: [id number] DOB: Aug 23, 1929 Today's Date: 01/10/2016    History of Present Illness Patient is a 82 y/o female with hx of PD, HTN, DVT, DM, CKD and A-fib presents s/p right hip hemiarthoplasty after fall at home.    PT Comments    Pt continues to require extensive 2 person A for all mobility.  Pt lethargic throughout session and will fall asleep when not being directly stimulated.  Son present and A with cueing familiar to pt.  Continue to feel SNF is most appropriate D/C venue for pt and discussed this with son.    Follow Up Recommendations  SNF     Equipment Recommendations  None recommended by PT    Recommendations for Other Services       Precautions / Restrictions Precautions Precautions: Fall Precaution Comments: direct anterior approach Restrictions Weight Bearing Restrictions: Yes RLE Weight Bearing: Weight bearing as tolerated    Mobility  Bed Mobility Overal bed mobility: Needs Assistance;+2 for physical assistance Bed Mobility: Supine to Sit     Supine to sit: Max assist;+2 for physical assistance;HOB elevated     General bed mobility comments: A with bil LEs, trunk, and scooting hips to EOB.  pt with minimal participation.    Transfers Overall transfer level: Needs assistance Equipment used: 2 person hand held assist Transfers: Sit to/from [employer] CorporationStand;Stand Pivot Transfers Sit to Stand: Max assist;+2 physical assistance Stand pivot transfers: Max assist;+2 physical assistance       General transfer comment: pt with minimal participation in coming to standing and needed use of pad under hips to come to stand and for guiding pivot to recliner.  A to move Bil LEs through pivot steps to recliner.  Son present this session to cue pt.    Ambulation/Gait                 Stairs            Wheelchair Mobility    Modified Rankin (Stroke Patients Only)       Balance Overall  balance assessment: Needs assistance;History of Falls Sitting-balance support: Bilateral upper extremity supported;Feet supported Sitting balance-Leahy Scale: Poor     Standing balance support: During functional activity Standing balance-Leahy Scale: Poor                      Cognition Arousal/Alertness: Lethargic (Can be aroused, but does not stay alert long.) Behavior During Therapy: Flat affect Overall Cognitive Status: Difficult to assess                      Exercises      General Comments        Pertinent Vitals/Pain Pain Assessment: Faces Faces Pain Scale: Hurts a little bit Pain Location: R Hip Pain Descriptors / Indicators: Grimacing Pain Intervention(s): Monitored during session;Repositioned    Home Living                      Prior Function            PT Goals (current goals can now be found in the care plan section) Acute Rehab PT Goals Patient Stated Goal: son wants to get pt home if possible PT Goal Formulation: With patient/family Time For Goal Achievement: 01/23/16 Potential to Achieve Goals: Fair Progress towards PT goals: Progressing toward goals    Frequency  Min 3X/week    PT Plan Frequency needs to  be updated    Co-evaluation             End of Session Equipment Utilized During Treatment: Gait belt Activity Tolerance: Patient limited by fatigue;Patient limited by lethargy Patient left: in chair;with call bell/phone within reach;with chair alarm set     Time: 1610-96041406-1438 PT Time Calculation (min) (ACUTE ONLY): 32 min  Charges:  $Therapeutic Activity: 23-37 mins                    G CodesSunny Schlein:      Alese Furniss F, South CarolinaPT [phone] 01/10/2016, 2:53 PM

## 2016-01-10 NOTE — Progress Notes (Signed)
Speech Language Pathology Treatment: Dysphagia  Patient Details Name: Sheila Watkins MRN: [id number] DOB: 1929/12/24 Today's Date: 01/10/2016 Time: 4696-29521439-1456 SLP Time Calculation (min) (ACUTE ONLY): 17 min  Assessment / Plan / Recommendation Clinical Impression  RN called SLP to report difficulty with noon meal. At time of therapy session, pt is poorly interactive and requires mod- mod to max verbal and tactile stim to maintain alertness. Pt verbalized she felt like she was falling and felt, "really bad." RN was alerted to pt complaints and change in mental status. Pt did not participate with oral mech despite max cues. Pt O x self only. Thought she was at the hamburger place and was asking about people who were not present in the room. Pt observed with thin liquids via straw and 1/2 t bite of puree and continues to demonstrate appearance of delayed pharyngeal initiation. Given decreased participation, recommend changing diet to Dys 2 with thins and only feeding when alert. Will follow.    HPI HPI: Ms. Sheila Watkins is an 82 year old female, with PMH listed below, that presented to the [facility] ED on 01/07/16 with c/o R hip pain.  She reports that she was at home and attempted to ambulate and fell.  She c/o of sharp R hip pain and was unable to bear any weight on her affected extremity.  Nonweightbearing and immobilization makes it feel best.  Radiographs of her R hip/pelvis were obtained and she was diagnosed with a R hip subcapital femoral neck fx.  [facility]  Dr. Toni ArthursJohn Hewitt was then consulted for definitive care.  She denies any previous R hip injury or surgery.  She resides with her son.  He is at the bedside with her this morning.  He states that she has been working with Physical Therapy to regain ambulatory status and strength at home.  She is a household ambulator with a walker.  She is currently NPO and her coumadin is being held.       SLP Plan  Continue with current plan of care     Recommendations  Diet recommendations: Dysphagia 2 (fine chop);Thin liquid Liquids provided via: Straw Medication Administration: Whole meds with puree (liquid wash to clear if needed) Supervision: Trained caregiver to feed patient Compensations: Small sips/bites;Follow solids with liquid Postural Changes and/or Swallow Maneuvers: Seated upright 90 degrees;Upright 30-60 min after meal             Follow up Recommendations: 24 hour supervision/assistance;Skilled Nursing facility Plan: Continue with current plan of care     GO                Rocky CraftsKara E Samoria Fedorko MA, CCC-SLP Pager [phone] 01/10/2016, 3:03 PM

## 2016-01-10 NOTE — Progress Notes (Signed)
SLP Cancellation Note  Patient Details Name: Sheila Watkins MRN: [id number] DOB: [birth year]   Cancelled treatment:       Reason Eval/Treat Not Completed: Patient at procedure or test/unavailable. Will follow.   Rocky CraftsKara E Katy Brickell MA, CCC-SLP Pager [phone] 01/10/2016, 11:52 AM

## 2016-01-10 NOTE — Progress Notes (Addendum)
PROGRESS NOTE  Sheila Watkins  ZOX:[id number] DOB: 16-Jan-1930 DOA: 01/07/2016 PCP: Mauricia Area, MD  Brief Narrative:  [name] 82 y.o.female,with Pafib, chad2svasc=5, Parkinsons, Presents with c/o fall while at home getting out of chair. Mechanical fall. Presented to [facility] for evaluation. In ED, Found to have R hip fracture. Pt transported to [facility] for evaluation. Dr. Victorino Dike performed right hip hemiarthroplasty on 8/21.    Assessment & Plan:   Active Problems:   Hip fracture, right (HCC)   Chronic atrial fibrillation (HCC)   Femoral neck fracture (HCC)   Femoral neck fracture, right, closed, initial encounter   Anemia of chronic disease   Chronic kidney disease (CKD), stage IV (severe) (HCC)   Parkinson disease (HCC)   Displaced right femoral neck fracture: Repaired on 01/08/2015. POD#2 - Management per orthopedics - warfarin resumed  -  D/c ultram and IV narcotics -  Continue hydrocodone-APAP prn  Post-operative blood loss anemia superimposed on chronic iron and borderline vitamin B12 deficiency -  feraheme once -  D/c oral iron supplementation until tolerating PO better -  Start once daily vitamin b12 supplementation  Parkinson's: Advanced.  Family clarified her home doses of medications with me today.  She has been on lower than usual doses since admission which may be contributing to her slow recovery.   - Continue Sinemet -  Increased ropinirole to 1 tab at breakfast and lunch and 2 tabs in the evening - PT recommending SNF -  SLP changed diet recommendation to dysphagia 2 with thin and gave family additional instructions for safe eating - Continue Neurontin  Acute on CKD: Creatinine continuing to to trend up - replace PIV - IVF - BMP in a.m.  Chronic atrial fibrillation and WJX:[id number] = 6. - Anticoagulation resumed  Depression: - Continue Zoloft, Requip  Right lower extremity cellulitis: Marked improvement  since admission. - DC IV vancomycin. 01/08/2016>>01/09/2016 - continue Keflex. 01/09/16 >>  Hypotension: per review of care everywhere BP appears to run 100-110. No antihypertensive medications. Bp becoming more soft. And requiring more O2 - CXR:  Bilateral trace effusions and opacities c/w atelectasis - Echo:  Grade 2 DD with severely dilated left atrium and moderately dilated right atrium.  PA peak pressure 57 mm Hg -  IVF  DVT prophylaxis: coumadin Code Status: FULL Family Communication: Nurse, mental health.  Agreed to communicate with Sheila Watkins daily.  Spoke on phone with additional son and patient's daughter Disposition Plan: Pending improvement after hip fracture repair. - likely SNF placement.  Needs improvement in kidney function and oral intake.  Prognosis is guarded secondary to recent surgery in the setting of advanced parkinson's.    Consultants:   ortho   Procedures:   R fem fracture repair 01/08/16  Antimicrobials:  Vancomycin 8/21>>8/22  Subjective:  Difficulty communicating, but left shoulder is feeling better.  Denies shortness of breath, chest pain.  Not eating much but son states that is because she does not like the food.    Objective: Vitals:   01/09/16 2108 01/10/16 0500 01/10/16 1300 01/10/16 1453  BP: (!) 130/50 (!) 120/47 136/62 (!) 113/52  Pulse: 75 70 72 74  Resp: 18 20 20    Temp: 97.9 F (36.6 C) 98.4 F (36.9 C) 98.2 F (36.8 C)   TempSrc: Oral Oral Oral   SpO2: 94% 90% 96%   Weight:  82.6 kg (182 lb 1.6 oz)    Height:        Intake/Output Summary (Last 24 hours)  at 01/10/16 1657 Last data filed at 01/09/16 1700  Gross per 24 hour  Intake               60 ml  Output                0 ml  Net               60 ml   Filed Weights   01/08/16 0332 01/08/16 1544 01/10/16 0500  Weight: 78.4 kg (172 lb 13.5 oz) 78 kg (172 lb) 82.6 kg (182 lb 1.6 oz)    Examination:  General exam:  Adult female, mild tachypnea to the 20s, shallow breathing.  Frail  and relatively still in bed.  Flat affect HEENT:  NCAT, MMM Respiratory system:  Rales in dependent areas that clear somewhat with deep breaths Cardiovascular system: Regular rate and rhythm, normal S1/S2. No murmurs, rubs, gallops or clicks.  Warm extremities Gastrointestinal system: Normal active bowel sounds, soft, nondistended, nontender. MSK:  Normal tone and bulk, right lower extremity edema,  Bandage along anterolateral hip is clean/dry/intact.  Swelling present, but minimal bruising and no palpable hematoma Neuro:  Flat affect, slow to speak. Cogwheel rigidity    Data Reviewed: I have personally reviewed following labs and imaging studies  CBC:  Recent Labs Lab 01/07/16 1600 01/08/16 0539 01/08/16 2050 01/09/16 0516 01/10/16 0343  WBC 7.6 10.5 11.8* 9.1 8.7  NEUTROABS 6.6 9.6*  --   --   --   HGB 10.4* 10.9* 10.4* 8.6* 8.6*  HCT 32.9* 34.9* 34.5* 28.5* 28.6*  MCV 92.9 92.3 93.0 93.1 93.8  PLT 232 267 223 204 181   Basic Metabolic Panel:  Recent Labs Lab 01/07/16 1600 01/08/16 0539 01/08/16 2050 01/09/16 0516 01/10/16 0343  NA 139 141  --  138 137  K 3.7 3.8  --  3.9 3.9  CL 106 108  --  108 109  CO2 23 24  --  24 22  GLUCOSE 130* 144*  --  135* 131*  BUN 36* 29*  --  25* 32*  CREATININE 1.86* 1.57* 1.64* 1.98* 2.30*  CALCIUM 8.9 9.0  --  8.2* 8.0*   GFR: Estimated Creatinine Clearance: 20.1 mL/min (by C-G formula based on SCr of 2.3 mg/dL). Liver Function Tests:  Recent Labs Lab 01/08/16 0539  AST 26  ALT 12*  ALKPHOS 105  BILITOT 0.8  PROT 7.3  ALBUMIN 4.2   No results for input(s): LIPASE, AMYLASE in the last 168 hours. No results for input(s): AMMONIA in the last 168 hours. Coagulation Profile:  Recent Labs Lab 01/07/16 1600 01/08/16 0539 01/09/16 0516 01/10/16 0343  INR 1.35 1.23 1.50 2.35   Cardiac Enzymes: No results for input(s): CKTOTAL, CKMB, CKMBINDEX, TROPONINI in the last 168 hours. BNP (last 3 results) No results for  input(s): PROBNP in the last 8760 hours. HbA1C:  Recent Labs  01/08/16 0539  HGBA1C 6.4*   CBG:  Recent Labs Lab 01/08/16 0327 01/08/16 0736 01/08/16 1844 01/10/16 0614 01/10/16 1634  GLUCAP 140* 136* 121* 166* 130*   Lipid Profile: No results for input(s): CHOL, HDL, LDLCALC, TRIG, CHOLHDL, LDLDIRECT in the last 72 hours. Thyroid Function Tests: No results for input(s): TSH, T4TOTAL, FREET4, T3FREE, THYROIDAB in the last 72 hours. Anemia Panel:  Recent Labs  01/10/16 0343  VITAMINB12 227  FOLATE 17.8  FERRITIN 146  TIBC 200*  IRON 13*  RETICCTPCT 2.6   Urine analysis: No results found for: COLORURINE, APPEARANCEUR, LABSPEC, PHURINE,  GLUCOSEU, HGBUR, BILIRUBINUR, KETONESUR, PROTEINUR, UROBILINOGEN, NITRITE, LEUKOCYTESUR Sepsis Labs: @LABRCNTIP (procalcitonin:4,lacticidven:4)  ) Recent Results (from the past 240 hour(s))  MRSA PCR Screening     Status: None   Collection Time: 01/08/16  2:56 AM  Result Value Ref Range Status   MRSA by PCR NEGATIVE NEGATIVE Final    Comment:        The GeneXpert MRSA Assay (FDA approved for NASAL specimens only), is one component of a comprehensive MRSA colonization surveillance program. It is not intended to diagnose MRSA infection nor to guide or monitor treatment for MRSA infections.   Culture, blood (routine x 2)     Status: None (Preliminary result)   Collection Time: 01/08/16  9:20 AM  Result Value Ref Range Status   Specimen Description BLOOD LEFT HAND  Final   Special Requests IN PEDIATRIC BOTTLE 1 CC  Final   Culture   Final    NO GROWTH 2 DAYS Per[facility]    Report Status PENDING  Incomplete  Culture, blood (routine x 2)     Status: None (Preliminary result)   Collection Time: 01/08/16  9:29 AM  Result Value Ref Range Status   Specimen Description BLOOD LEFT HAND  Final   Special Requests BOTTLES DRAWN AEROBIC ONLY 1 CC  Final   Culture   Final    NO GROWTH 2 DAYS Performed at [facility]      Report Status PENDING  Incomplete      Radiology Studies: Pelvis Portable  Result Date: 01/08/2016 CLINICAL DATA:  Postop film for right hip arthroplasty. EXAM: PORTABLE PELVIS 1-2 VIEWS COMPARISON:  None. FINDINGS: Status post right hip arthroplasty. Hardware appears intact and appropriately positioned. No evidence of surgical complicating feature. Expected postsurgical changes within the overlying soft tissues. Adjacent osseous pelvis appears intact and normally aligned. Degenerative change noted in the lower lumbar spine. IMPRESSION: Expected postsurgical changes status post right hip arthroplasty. Hardware appears appropriately positioned. No evidence of surgical complicating feature. Electronically Signed   By: Bary RichardStan  Maynard M.D.   On: 01/08/2016 19:00   Dg Chest Port 1 View  Result Date: 01/09/2016 CLINICAL DATA:  Patient with hypoxemia.  Left shoulder pain. EXAM: PORTABLE CHEST 1 VIEW COMPARISON:  Chest radiograph 01/07/2016 FINDINGS: Stable enlarged cardiac and mediastinal contours. Heterogeneous opacities within the lung bases bilaterally. Small bilateral pleural effusions. Stable widening of the left AC joint and appearance of the distal left clavicle. Left shoulder joint degenerative changes. IMPRESSION: Cardiomegaly. Small bilateral pleural effusions and underlying opacities favored to represent atelectasis. Infection not excluded. In the setting of reported left shoulder pain, recommend dedicated evaluation with left shoulder radiographs. Electronically Signed   By: Annia Beltrew  Davis M.D.   On: 01/09/2016 20:04   Dg Shoulder Left  Result Date: 01/10/2016 CLINICAL DATA:  Swelling and left shoulder pain. EXAM: LEFT SHOULDER - 2+ VIEW COMPARISON:  None. FINDINGS: Portable study with limited positioning and two views. No evidence of acute fracture or dislocation. No notable degenerative spurring. No soft tissue calcification. Intrathoracic findings as seen on dedicated chest  x-ray. IMPRESSION: Negative limited portable shoulder. Electronically Signed   By: Marnee SpringJonathon  Watts M.D.   On: 01/10/2016 03:28   Dg C-arm 1-60 Min  Result Date: 01/08/2016 CLINICAL DATA:  Right anterior total hip arthroplasty. EXAM: OPERATIVE RIGHT HIP (WITH PELVIS IF PERFORMED)  VIEWS TECHNIQUE: Fluoroscopic spot image(s) were submitted for interpretation post-operatively. COMPARISON:  None. FINDINGS: Two intraoperative fluoroscopic images are provided showing placement of right hip arthroplasty  hardware. Hardware appears appropriately positioned. No evidence of surgical complicating feature. Fluoroscopy provided for 18 seconds. IMPRESSION: Intraoperative fluoroscopic images from a total hip arthroplasty. No evidence of surgical complicating feature on these limited images. Fluoroscopy provided for 18 seconds. Electronically Signed   By: Bary RichardStan  Maynard M.D.   On: 01/08/2016 18:09   Dg Hip Operative Unilat W Or W/o Pelvis Right  Result Date: 01/08/2016 CLINICAL DATA:  Right anterior total hip arthroplasty. EXAM: OPERATIVE RIGHT HIP (WITH PELVIS IF PERFORMED)  VIEWS TECHNIQUE: Fluoroscopic spot image(s) were submitted for interpretation post-operatively. COMPARISON:  None. FINDINGS: Two intraoperative fluoroscopic images are provided showing placement of right hip arthroplasty hardware. Hardware appears appropriately positioned. No evidence of surgical complicating feature. Fluoroscopy provided for 18 seconds. IMPRESSION: Intraoperative fluoroscopic images from a total hip arthroplasty. No evidence of surgical complicating feature on these limited images. Fluoroscopy provided for 18 seconds. Electronically Signed   By: Bary RichardStan  Maynard M.D.   On: 01/08/2016 18:09     Scheduled Meds: . carbidopa-levodopa  1 tablet Oral TID  . cephALEXin  250 mg Oral Q8H  . docusate sodium  100 mg Oral BID  . ferrous sulfate  325 mg Oral TID PC  . ferumoxytol  510 mg Intravenous Once  . [START ON 01/11/2016] gabapentin   300 mg Oral QHS  . rOPINIRole  3 mg Oral BID  . rOPINIRole  6 mg Oral QHS  . senna  1 tablet Oral BID  . sertraline  50 mg Oral Daily  . vitamin B-12  1,000 mcg Oral Daily  . Warfarin - Pharmacist Dosing Inpatient   Does not apply q1800   Continuous Infusions: . sodium chloride 75 mL/hr at 01/10/16 1334  . sodium chloride       LOS: 3 days    Time spent: 30 min    Renae FickleSHORT, Adi Doro, MD [facility] Pager [phone]  If 7PM-7AM, please contact night-coverage [email or web address] Password [email or web address] 01/10/2016, 4:57 PM

## 2016-01-10 NOTE — Progress Notes (Signed)
  Echocardiogram 2D Echocardiogram has been performed.  Cathie BeamsGREGORY, Zalan Shidler 01/10/2016, 11:31 AM

## 2016-01-10 NOTE — Progress Notes (Signed)
   Subjective:  Patient reports pain as mild to moderate.  No c/o.  Objective:   VITALS:   Vitals:   01/09/16 0512 01/09/16 1449 01/09/16 2108 01/10/16 0500  BP: (!) 94/59 (!) 96/46 (!) 130/50 (!) 120/47  Pulse: 67 72 75 70  Resp: 16 16 18 20   Temp: 98.1 F (36.7 C) 98.6 F (37 C) 97.9 F (36.6 C) 98.4 F (36.9 C)  TempSrc: Oral Axillary Oral Oral  SpO2: 90% 91% 94% 90%  Weight:    82.6 kg (182 lb 1.6 oz)  Height:        ABD soft Sensation intact distally Intact pulses distally Dorsiflexion/Plantar flexion intact Incision: dressing C/D/I Compartment soft R ankle cellulitis improving   Lab Results  Component Value Date   WBC 8.7 01/10/2016   HGB 8.6 (L) 01/10/2016   HCT 28.6 (L) 01/10/2016   MCV 93.8 01/10/2016   PLT 181 01/10/2016   BMET    Component Value Date/Time   NA 137 01/10/2016 0343   K 3.9 01/10/2016 0343   CL 109 01/10/2016 0343   CO2 22 01/10/2016 0343   GLUCOSE 131 (H) 01/10/2016 0343   BUN 32 (H) 01/10/2016 0343   CREATININE 2.30 (H) 01/10/2016 0343   CALCIUM 8.0 (L) 01/10/2016 0343   GFRNONAA 18 (L) 01/10/2016 0343   GFRAA 21 (L) 01/10/2016 0343     Assessment/Plan: 2 Days Post-Op   Active Problems:   Hip fracture, right (HCC)   Chronic atrial fibrillation (HCC)   Femoral neck fracture (HCC)   Femoral neck fracture, right, closed, initial encounter   Anemia of chronic disease   Chronic kidney disease (CKD), stage IV (severe) (HCC)   Parkinson disease (HCC)   WBAT with walker DVT ppx: coumadin with lovenox bridge R ankle cellulitis per primary team (present preop) PT/OT Dispo: continue mobilizing with therapy, can likely d/c home with HHPT depending on progress, please discuss d/c plan with Chong SicilianJill Lauer, RN [facility] manager [facility]    Hanifa Antonetti, Cloyde ReamsBrian James 01/10/2016, 1:42 PM   Samson FredericBrian Allien Melberg, MD Cell [phone]

## 2016-01-10 NOTE — Progress Notes (Signed)
ANTICOAGULATION CONSULT NOTE   Pharmacy Consult for Coumadin Indication: atrial fibrillation and VTE prophylaxis post-op hip surgery  Allergies  Allergen Reactions  . Clonidine Derivatives     Reaction unknown  . Lisinopril     Reaction unknown  . Macrobid [Nitrofurantoin [employer]yd Macro] Hives  . Sulfa Antibiotics     Reaction unknown  . Valsartan     Reaction unknown    Patient Measurements: Height: 5\' 7"  (170.2 cm) Weight: 182 lb 1.6 oz (82.6 kg) IBW/kg (Calculated) : 61.6  Vital Signs: Temp: 98.4 F (36.9 C) (08/23 0500) Temp Source: Oral (08/23 0500) BP: 120/47 (08/23 0500) Pulse Rate: 70 (08/23 0500)  Labs:  Recent Labs  01/08/16 0539 01/08/16 2050 01/09/16 0516 01/10/16 0343  HGB 10.9* 10.4* 8.6* 8.6*  HCT 34.9* 34.5* 28.5* 28.6*  PLT 267 223 204 181  LABPROT 15.6*  --  18.3* 26.2*  INR 1.23  --  1.50 2.35  CREATININE 1.57* 1.64* 1.98* 2.30*    Estimated Creatinine Clearance: 20.1 mL/min (by C-G formula based on SCr of 2.3 mg/dL).  Assessment:  82 yr old female continues on coumadin for history of afib and remote DVT.  INR has trended up very quickly from 1.5>>2.3 overnight. CBC has remained stable and patient reports no bleeding issues. No warfarin charted yesterday, patient unable to tell me if she did receive it last night. Nurse today is new to patient. Son in room was not here, did discuss with him that with INR trend will hold dose tonight and follow.  Home Coumadin regimen: 4 mg Sun, Wed and Fridays; 2 mg Mon, Tues, Thurs and Saturdays.  Goal of Therapy:  INR 2-3 Monitor platelets by anticoagulation protocol: Yes   Plan:  Hold warfarin tonight Daily INR DC lovenox with therapeutic INR Vancomycin stopped  Sheppard CoilFrank Akaisha Truman PharmD., BCPS Clinical Pharmacist Pager [phone] 01/10/2016 1:42 PM

## 2016-01-10 NOTE — Clinical Social Work Note (Signed)
Clinical Social Work Assessment  Patient Details  Name: Sheila Watkins MRN: [id number] Date of Birth: [birth year]  Date of referral:  01/10/16               Reason for consult:  Facility Placement                Permission sought to share information with:   (Facilities) Permission granted to share information::   (Facilities)  Name::        Agency::     Relationship::     Contact Information:     Housing/Transportation Living arrangements for the past 2 months:  Single Family Home (Son states pt lives home with him in [place]) Source of Information:  Adult Children (Son/Marty Hruska [phone]) Patient Interpreter Needed:  None Criminal Activity/Legal Involvement Pertinent to Current Situation/Hospitalization:  No - Comment as needed Significant Relationships:  Adult Children Lives with:  Adult Children Do you feel safe going back to the place where you live?   (Family is interested in facility and have chosen to go to [facility]) Need for family participation in patient care:  Yes (Comment) (Son has been supportive and involved.)  Care giving concerns:  Patient needs assistance with ADL's, Family is interested in facility for patient. Patient will go to [facility] upon discharge.   Social Worker assessment / plan:  SW met with son at bedside. He states that the pt broke her R hip Sunday due to a fall. Son states that pt will need care upon discharge. Son would like pt to go to a facility. Son states that prior to coming to hospital the pt had home health with [facility] agency.  Employment status:  Retired Forensic scientist:  Medicare PT Recommendations:  [place] / Referral to community resources:   ([place])  Patient/Family's Response to care:  Son is accepting at this time.  Patient/Family's Understanding of and Emotional Response to Diagnosis, Current Treatment, and Prognosis:  Son has no questions.  Emotional  Assessment Appearance:  Appears stated age Attitude/Demeanor/Rapport:   (Lethargic) Affect (typically observed):  Appropriate Orientation:  Oriented to Self Alcohol / Substance use:  Not Applicable Psych involvement (Current and /or in the community):  No (Comment)  Discharge Needs  Concerns to be addressed:  Adjustment to Illness Readmission within the last 30 days:  No Current discharge risk:  None Barriers to Discharge:  No Barriers Identified   Bernita Buffy 01/10/2016, 3:54 PM

## 2016-01-10 NOTE — Discharge Instructions (Signed)
°Dr. Brian Swinteck °Joint Replacement Specialist °[facility] °[address] °[place],  [address] °[phone] ° ° °TOTAL HIP REPLACEMENT POSTOPERATIVE DIRECTIONS ° ° ° °Hip Rehabilitation, Guidelines Following Surgery  ° °WEIGHT BEARING °Weight bearing as tolerated with assist device (walker, cane, etc) as directed, use it as long as suggested by your surgeon or therapist, typically at least 4-6 weeks. ° °The results of a hip operation are greatly improved after range of motion and muscle strengthening exercises. Follow all safety measures which are given to protect your hip. If any of these exercises cause increased pain or swelling in your joint, decrease the amount until you are comfortable again. Then slowly increase the exercises. Call your caregiver if you have problems or questions.  ° °HOME CARE INSTRUCTIONS  °Most of the following instructions are designed to prevent the dislocation of your new hip.  °Remove items at home which could result in a fall. This includes throw rugs or furniture in walking pathways.  °Continue medications as instructed at time of discharge. °· You may have some home medications which will be placed on hold until you complete the course of blood thinner medication. °· You may start showering once you are discharged home. Do not remove your dressing. °Do not put on socks or shoes without following the instructions of your caregivers.   °Sit on chairs with arms. Use the chair arms to help push yourself up when arising.  °Arrange for the use of a toilet seat elevator so you are not sitting low.  °· Walk with walker as instructed.  °You may resume a sexual relationship in one month or when given the OK by your caregiver.  °Use walker as long as suggested by your caregivers.  °You may put full weight on your legs and walk as much as is comfortable. °Avoid periods of inactivity such as sitting longer than an hour when not asleep. This helps prevent  blood clots.  °You may return to work once you are cleared by your surgeon.  °Do not drive a car for 6 weeks or until released by your surgeon.  °Do not drive while taking narcotics.  °Wear elastic stockings for two weeks following surgery during the day but you may remove then at night.  °Make sure you keep all of your appointments after your operation with all of your doctors and caregivers. You should call the office at the above phone number and make an appointment for approximately two weeks after the date of your surgery. °Please pick up a stool softener and laxative for home use as long as you are requiring pain medications. °· ICE to the affected hip every three hours for 30 minutes at a time and then as needed for pain and swelling. Continue to use ice on the hip for pain and swelling from surgery. You may notice swelling that will progress down to the foot and ankle.  This is normal after surgery.  Elevate the leg when you are not up walking on it.   °It is important for you to complete the blood thinner medication as prescribed by your doctor. °· Continue to use the breathing machine which will help keep your temperature down.  It is common for your temperature to cycle up and down following surgery, especially at night when you are not up moving around and exerting yourself.  The breathing machine keeps your lungs expanded and your temperature down. ° °RANGE OF MOTION AND STRENGTHENING EXERCISES  °These exercises are   designed to help you keep full movement of your hip joint. Follow your caregiver's or physical therapist's instructions. Perform all exercises about fifteen times, three times per day or as directed. Exercise both hips, even if you have had only one joint replacement. These exercises can be done on a training (exercise) mat, on the floor, on a table or on a bed. Use whatever works the best and is most comfortable for you. Use music or television while you are exercising so that the exercises  are a pleasant break in your day. This will make your life better with the exercises acting as a break in routine you can look forward to.  Lying on your back, slowly slide your foot toward your buttocks, raising your knee up off the floor. Then slowly slide your foot back down until your leg is straight again.  Lying on your back spread your legs as far apart as you can without causing discomfort.  Lying on your side, raise your upper leg and foot straight up from the floor as far as is comfortable. Slowly lower the leg and repeat.  Lying on your back, tighten up the muscle in the front of your thigh (quadriceps muscles). You can do this by keeping your leg straight and trying to raise your heel off the floor. This helps strengthen the largest muscle supporting your knee.  Lying on your back, tighten up the muscles of your buttocks both with the legs straight and with the knee bent at a comfortable angle while keeping your heel on the floor.   SKILLED REHAB INSTRUCTIONS: If the patient is transferred to a skilled rehab facility following release from the hospital, a list of the current medications will be sent to the facility for the patient to continue.  When discharged from the skilled rehab facility, please have the facility set up the patient's Home Health Physical Therapy prior to being released. Also, the skilled facility will be responsible for providing the patient with their medications at time of release from the facility to include their pain medication and their blood thinner medication. If the patient is still at the rehab facility at time of the two week follow up appointment, the skilled rehab facility will also need to assist the patient in arranging follow up appointment in our office and any transportation needs.  MAKE SURE YOU:  Understand these instructions.  Will watch your condition.  Will get help right away if you are not doing well or get worse.  Pick up stool softner and  laxative for home use following surgery while on pain medications. Do not remove your dressing. The dressing is waterproof--it is OK to take showers. Continue to use ice for pain and swelling after surgery. Do not use any lotions or creams on the incision until instructed by your surgeon. Total Hip Protocol.  Information on my medicine - Coumadin   (Warfarin)  This medication education was reviewed with me or my healthcare representative as part of my discharge preparation.  The pharmacist that spoke with me during my hospital stay was:  Severiano GilbertWilson, Mika Griffitts Rhea, [facility] CampusRPH  Why was Coumadin prescribed for you? Coumadin was prescribed for you because you have a blood clot or a medical condition that can cause an increased risk of forming blood clots. Blood clots can cause serious health problems by blocking the flow of blood to the heart, lung, or brain. Coumadin can prevent harmful blood clots from forming. As a reminder your indication for Coumadin is:  Stroke Prevention Because Of Atrial Fibrillation, history of blood clot  What test will check on my response to Coumadin? While on Coumadin (warfarin) you will need to have an INR test regularly to ensure that your dose is keeping you in the desired range. The INR (international normalized ratio) number is calculated from the result of the laboratory test called prothrombin time (PT).  If an INR APPOINTMENT HAS NOT ALREADY BEEN MADE FOR YOU please schedule an appointment to have this lab work done by your health care provider within 7 days. Your INR goal is usually a number between:  2 to 3 or your provider may give you a more narrow range like 2-2.5.  Ask your health care provider during an office visit what your goal INR is.  What  do you need to  know  About  COUMADIN? Take Coumadin (warfarin) exactly as prescribed by your healthcare provider about the same time each day.  DO NOT stop taking without talking to the doctor who prescribed the medication.   Stopping without other blood clot prevention medication to take the place of Coumadin may increase your risk of developing a new clot or stroke.  Get refills before you run out.  What do you do if you miss a dose? If you miss a dose, take it as soon as you remember on the same day then continue your regularly scheduled regimen the next day.  Do not take two doses of Coumadin at the same time.  Important Safety Information A possible side effect of Coumadin (Warfarin) is an increased risk of bleeding. You should call your healthcare provider right away if you experience any of the following: ? Bleeding from an injury or your nose that does not stop. ? Unusual colored urine (red or dark brown) or unusual colored stools (red or black). ? Unusual bruising for unknown reasons. ? A serious fall or if you hit your head (even if there is no bleeding).  Some foods or medicines interact with Coumadin (warfarin) and might alter your response to warfarin. To help avoid this: ? Eat a balanced diet, maintaining a consistent amount of Vitamin K. ? Notify your provider about major diet changes you plan to make. ? Avoid alcohol or limit your intake to 1 drink for women and 2 drinks for men per day. (1 drink is 5 oz. wine, 12 oz. beer, or 1.5 oz. liquor.)  Make sure that ANY health care provider who prescribes medication for you knows that you are taking Coumadin (warfarin).  Also make sure the healthcare provider who is monitoring your Coumadin knows when you have started a new medication including herbals and non-prescription products.  Coumadin (Warfarin)  Major Drug Interactions  Increased Warfarin Effect Decreased Warfarin Effect  Alcohol (large quantities) Antibiotics (esp. Septra/Bactrim, Flagyl, Cipro) Amiodarone (Cordarone) Aspirin (ASA) Cimetidine (Tagamet) Megestrol (Megace) NSAIDs (ibuprofen, naproxen, etc.) Piroxicam (Feldene) Propafenone (Rythmol SR) Propranolol (Inderal) Isoniazid  (INH) Posaconazole (Noxafil) Barbiturates (Phenobarbital) Carbamazepine (Tegretol) Chlordiazepoxide (Librium) Cholestyramine (Questran) Griseofulvin Oral Contraceptives Rifampin Sucralfate (Carafate) Vitamin K   Coumadin (Warfarin) Major Herbal Interactions  Increased Warfarin Effect Decreased Warfarin Effect  Garlic Ginseng Ginkgo biloba Coenzyme Q10 Green tea St. Johns wort    Coumadin (Warfarin) FOOD Interactions  Eat a consistent number of servings per week of foods HIGH in Vitamin K (1 serving =  cup)  Collards (cooked, or boiled & drained) Kale (cooked, or boiled & drained) Mustard greens (cooked, or boiled & drained) Parsley *serving size only =  cup Spinach (cooked, or boiled & drained) Swiss chard (cooked, or boiled & drained) Turnip greens (cooked, or boiled & drained)  Eat a consistent number of servings per week of foods MEDIUM-HIGH in Vitamin K (1 serving = 1 cup)  Asparagus (cooked, or boiled & drained) Broccoli (cooked, boiled & drained, or raw & chopped) Brussel sprouts (cooked, or boiled & drained) *serving size only =  cup Lettuce, raw (green leaf, endive, romaine) Spinach, raw Turnip greens, raw & chopped   These websites have more information on Coumadin (warfarin):  FailFactory.se; VeganReport.com.au;

## 2016-01-11 DIAGNOSIS — I482 Chronic atrial fibrillation: Secondary | ICD-10-CM

## 2016-01-11 LAB — BASIC METABOLIC PANEL
ANION GAP: 7 (ref 5–15)
BUN: 27 mg/dL — AB (ref 6–20)
CO2: 22 mmol/L (ref 22–32)
Calcium: 8.4 mg/dL — ABNORMAL LOW (ref 8.9–10.3)
Chloride: 109 mmol/L (ref 101–111)
Creatinine, Ser: 1.83 mg/dL — ABNORMAL HIGH (ref 0.44–1.00)
GFR, EST AFRICAN AMERICAN: 28 mL/min — AB (ref >60)
GFR, EST NON AFRICAN AMERICAN: 24 mL/min — AB (ref >60)
Glucose, Bld: 114 mg/dL — ABNORMAL HIGH (ref 65–99)
POTASSIUM: 3.7 mmol/L (ref 3.5–5.1)
SODIUM: 138 mmol/L (ref 135–145)

## 2016-01-11 LAB — CBC
HEMATOCRIT: 27.1 % — AB (ref 36.0–46.0)
Hemoglobin: 8.3 g/dL — ABNORMAL LOW (ref 12.0–15.0)
MCH: 28.4 pg (ref 26.0–34.0)
MCHC: 30.6 g/dL (ref 30.0–36.0)
MCV: 92.8 fL (ref 78.0–100.0)
Platelets: 207 10*3/uL (ref 150–400)
RBC: 2.92 MIL/uL — ABNORMAL LOW (ref 3.87–5.11)
RDW: 16.7 % — ABNORMAL HIGH (ref 11.5–15.5)
WBC: 8.2 10*3/uL (ref 4.0–10.5)

## 2016-01-11 LAB — GLUCOSE, CAPILLARY: GLUCOSE-CAPILLARY: 128 mg/dL — AB (ref 65–99)

## 2016-01-11 LAB — PROTIME-INR
INR: 2.56
Prothrombin Time: 28 seconds — ABNORMAL HIGH (ref 11.4–15.2)

## 2016-01-11 MED ORDER — CARBIDOPA-LEVODOPA 25-100 MG PO TABS
1.5000 | ORAL_TABLET | Freq: Three times a day (TID) | ORAL | Status: DC
  Administered 2016-01-12 – 2016-01-13 (×5): 1.5 via ORAL
  Filled 2016-01-11 (×6): qty 2

## 2016-01-11 MED ORDER — ENSURE ENLIVE PO LIQD
237.0000 mL | Freq: Two times a day (BID) | ORAL | Status: DC
  Administered 2016-01-12: 237 mL via ORAL

## 2016-01-11 MED ORDER — CARBIDOPA-LEVODOPA 25-100 MG PO TABS
0.5000 | ORAL_TABLET | Freq: Once | ORAL | Status: AC
  Administered 2016-01-11: 0.5 via ORAL
  Filled 2016-01-11: qty 1

## 2016-01-11 MED ORDER — WARFARIN SODIUM 1 MG PO TABS
1.5000 mg | ORAL_TABLET | Freq: Once | ORAL | Status: AC
  Administered 2016-01-11: 1.5 mg via ORAL
  Filled 2016-01-11: qty 1

## 2016-01-11 NOTE — Progress Notes (Signed)
   Subjective:  Patient reports pain as mild to moderate.  No c/o.  Objective:   VITALS:   Vitals:   01/10/16 1453 01/10/16 2038 01/11/16 0500 01/11/16 0623  BP: (!) 113/52 (!) 140/46  (!) 151/62  Pulse: 74 81  63  Resp:  20  20  Temp:  98 F (36.7 C)  97.6 F (36.4 C)  TempSrc:  Oral  Oral  SpO2:  95%  96%  Weight:   82.3 kg (181 lb 7 oz)   Height:        ABD soft Sensation intact distally Intact pulses distally Dorsiflexion/Plantar flexion intact Incision: dressing C/D/I Compartment soft R ankle cellulitis improving   Lab Results  Component Value Date   WBC 8.2 01/11/2016   HGB 8.3 (L) 01/11/2016   HCT 27.1 (L) 01/11/2016   MCV 92.8 01/11/2016   PLT 207 01/11/2016   BMET    Component Value Date/Time   NA 138 01/11/2016 0549   K 3.7 01/11/2016 0549   CL 109 01/11/2016 0549   CO2 22 01/11/2016 0549   GLUCOSE 114 (H) 01/11/2016 0549   BUN 27 (H) 01/11/2016 0549   CREATININE 1.83 (H) 01/11/2016 0549   CALCIUM 8.4 (L) 01/11/2016 0549   GFRNONAA 24 (L) 01/11/2016 0549   GFRAA 28 (L) 01/11/2016 0549     Assessment/Plan: 3 Days Post-Op   Active Problems:   Hip fracture, right (HCC)   Chronic atrial fibrillation (HCC)   Femoral neck fracture (HCC)   Femoral neck fracture, right, closed, initial encounter   Anemia of chronic disease   Chronic kidney disease (CKD), stage IV (severe) (HCC)   Parkinson disease (HCC)   WBAT with walker DVT ppx: coumadin with lovenox bridge R ankle cellulitis per primary team (present preop) PT/OT Dispo: continue mobilizing with therapy, can likely d/c home with HHPT depending on progress, please discuss d/c plan with Chong SicilianJill Lauer, [facility]    Shanielle Correll, Cloyde ReamsBrian James 01/11/2016, 8:01 AM   Samson FredericBrian Kalai Baca, MD Cell [phone]

## 2016-01-11 NOTE — Progress Notes (Addendum)
PROGRESS NOTE  Sheila Watkins  ZOX:[id number] DOB: 24-Feb-1930 DOA: 01/07/2016 PCP: Sheila Area, MD  Brief Narrative:  [name] 82 y.o.female,with Pafib, chad2svasc=5, Parkinsons, Presents with c/o fall while at home getting out of chair. Mechanical fall. [facility] to [facility] for [facility]. In ED, Found to have R hip fracture. Pt transported to [facility] for evaluation. Dr. Victorino Watkins performed right hip hemiarthroplasty on 8/21.    Assessment & Plan:   Active Problems:   Hip fracture, right (HCC)   Chronic atrial fibrillation (HCC)   Femoral neck fracture (HCC)   Femoral neck fracture, right, closed, initial encounter   Anemia of chronic disease   Chronic kidney disease (CKD), stage IV (severe) (HCC)   Parkinson disease (HCC)   Displaced right femoral neck fracture: Repaired on 01/08/1979. POD#3 - Management per orthopedics - warfarin resumed  - Continue hydrocodone-APAP prn  Post-operative blood loss anemia superimposed on chronic iron and borderline vitamin B12 deficiency -  feraheme once -  D/c oral iron supplementation until tolerating PO better -  Start once daily vitamin b12 supplementation  Parkinson's: Advanced.  Case discussed with her primary Neurologist, Dr. Antonietta Watkins - Increase Sinemet - Stop ropinirole - PT recommending SNF -  SLP assessing daily.  Patient not eating. - Continue Neurontin  Not eating or drinking -  appreciate SLP assistance -  Nutrition consultation -  Start supplements  Acute on CKD: Creatinine improved with IVF - BMP in a.m.  Chronic atrial fibrillation and WJX:[id number] = 6. -  Rate controlled - Anticoagulation resumed  Depression: - Continue Zoloft, Requip  Right lower extremity cellulitis: Marked improvement since admission. - DC IV vancomycin. 01/08/2016>>01/09/2016 - continue Keflex. 01/09/16 >> 8/27  Hypotension: resolved - CXR:  Bilateral trace effusions and opacities c/w  atelectasis - Echo:  Grade 2 DD with severely dilated left atrium and moderately dilated right atrium.  PA peak pressure 57 mm Hg  OSA, attempt CPAP application  DVT prophylaxis: coumadin Code Status: FULL.  Family bringing in living will/advanced directives. Family Communication: Nurse, mental health.  Agreed to communicate with Sheila Watkins daily.   Disposition Plan:  SNF placement once tolerating food/drink by mouth, more alert.  Will likely take longer to recover given Parkinson's and pre-hospitalization deconditioning.  At risk for aspiration, respiratory compromise, post-operative infection.      Consultants:   ortho   Procedures:   R fem fracture repair 01/08/16  Antimicrobials:  Vancomycin 8/21>>8/22  Subjective:  Lethargic.  Moans but does not speak.  Minimally communicative today.    Objective: Vitals:   01/10/16 2038 01/11/16 0500 01/11/16 0623 01/11/16 1500  BP: (!) 140/46  (!) 151/62 (!) 155/59  Pulse: 81  63 66  Resp: 20  20 20   Temp: 98 F (36.7 C)  97.6 F (36.4 C) 99.1 F (37.3 C)  TempSrc: Oral  Oral Oral  SpO2: 95%  96% 100%  Weight:  82.3 kg (181 lb 7 oz)    Height:        Intake/Output Summary (Last 24 hours) at 01/11/16 1734 Last data filed at 01/11/16 1500  Gross per 24 hour  Intake               20 ml  Output                0 ml  Net               20 ml   Filed Weights   01/08/16 1544 01/10/16  0500 01/11/16 0500  Weight: 78 kg (172 lb) 82.6 kg (182 lb 1.6 oz) 82.3 kg (181 lb 7 oz)    Examination:  General exam:  Adult female.  Frail appearing, lying in bed with mouth open.  Flat affect HEENT:  NCAT, MMM Respiratory system:  Rales in dependent areas that clear somewhat with deep breaths Cardiovascular system: Regular rate and rhythm, normal S1/S2. No murmurs, rubs, gallops or clicks.  Warm extremities Gastrointestinal system: Normal active bowel sounds, soft, nondistended, nontender. MSK:  Normal tone and bulk, right lower extremity edema,   Bandage along anterolateral hip is clean/dry/intact.  Swelling present, but minimal bruising and no palpable hematoma Neuro:  Flat affect. Cogwheel rigidity    Data Reviewed: I have personally reviewed following labs and imaging studies  CBC:  Recent Labs Lab 01/07/16 1600 01/08/16 0539 01/08/16 2050 01/09/16 0516 01/10/16 0343 01/11/16 0549  WBC 7.6 10.5 11.8* 9.1 8.7 8.2  NEUTROABS 6.6 9.6*  --   --   --   --   HGB 10.4* 10.9* 10.4* 8.6* 8.6* 8.3*  HCT 32.9* 34.9* 34.5* 28.5* 28.6* 27.1*  MCV 92.9 92.3 93.0 93.1 93.8 92.8  PLT 232 267 223 204 181 207   Basic Metabolic Panel:  Recent Labs Lab 01/07/16 1600 01/08/16 0539 01/08/16 2050 01/09/16 0516 01/10/16 0343 01/11/16 0549  NA 139 141  --  138 137 138  K 3.7 3.8  --  3.9 3.9 3.7  CL 106 108  --  108 109 109  CO2 23 24  --  24 22 22   GLUCOSE 130* 144*  --  135* 131* 114*  BUN 36* 29*  --  25* 32* 27*  CREATININE 1.86* 1.57* 1.64* 1.98* 2.30* 1.83*  CALCIUM 8.9 9.0  --  8.2* 8.0* 8.4*   GFR: Estimated Creatinine Clearance: 25.3 mL/min (by C-G formula based on SCr of 1.83 mg/dL). Liver Function Tests:  Recent Labs Lab 01/08/16 0539  AST 26  ALT 12*  ALKPHOS 105  BILITOT 0.8  PROT 7.3  ALBUMIN 4.2   No results for input(s): LIPASE, AMYLASE in the last 168 hours. No results for input(s): AMMONIA in the last 168 hours. Coagulation Profile:  Recent Labs Lab 01/07/16 1600 01/08/16 0539 01/09/16 0516 01/10/16 0343 01/11/16 0549  INR 1.35 1.23 1.50 2.35 2.56   Cardiac Enzymes: No results for input(s): CKTOTAL, CKMB, CKMBINDEX, TROPONINI in the last 168 hours. BNP (last 3 results) No results for input(s): PROBNP in the last 8760 hours. HbA1C: No results for input(s): HGBA1C in the last 72 hours. CBG:  Recent Labs Lab 01/08/16 1844 01/10/16 0614 01/10/16 1634 01/10/16 2117 01/11/16 0645  GLUCAP 121* 166* 130* 120* 128*   Lipid Profile: No results for input(s): CHOL, HDL, LDLCALC, TRIG,  CHOLHDL, LDLDIRECT in the last 72 hours. Thyroid Function Tests: No results for input(s): TSH, T4TOTAL, FREET4, T3FREE, THYROIDAB in the last 72 hours. Anemia Panel:  Recent Labs  01/10/16 0343  VITAMINB12 227  FOLATE 17.8  FERRITIN 146  TIBC 200*  IRON 13*  RETICCTPCT 2.6   Urine analysis: No results found for: COLORURINE, APPEARANCEUR, LABSPEC, PHURINE, GLUCOSEU, HGBUR, BILIRUBINUR, KETONESUR, PROTEINUR, UROBILINOGEN, NITRITE, LEUKOCYTESUR Sepsis Labs: @LABRCNTIP (procalcitonin:4,lacticidven:4)  ) Recent Results (from the past 240 hour(s))  MRSA PCR Screening     Status: None   Collection Time: 01/08/16  2:56 AM  Result Value Ref Range Status   MRSA by PCR NEGATIVE NEGATIVE Final    Comment:        The  GeneXpert MRSA Assay (FDA approved for NASAL specimens only), is one component of a comprehensive MRSA colonization surveillance program. It is not intended to diagnose MRSA infection nor to guide or monitor treatment for MRSA infections.   Culture, blood (routine x 2)     Status: None (Preliminary result)   Collection Time: 01/08/16  9:20 AM  Result Value Ref Range Status   Specimen Description BLOOD LEFT HAND  Final   Special Requests IN PEDIATRIC BOTTLE 1 CC  Final   Culture   Final    NO GROWTH 3 DAYS Performed at [facility]    Report Status PENDING  Incomplete  Culture, blood (routine x 2)     Status: None (Preliminary result)   Collection Time: 01/08/16  9:29 AM  Result Value Ref Range Status   Specimen Description BLOOD LEFT HAND  Final   Special Requests BOTTLES DRAWN AEROBIC ONLY 1 CC  Final   Culture   Final    NO GROWTH 3 DAYS Performed at [facility]    Report Status PENDING  Incomplete      Radiology Studies: Dg Chest Port 1 View  Result Date: 01/09/2016 CLINICAL DATA:  Patient with hypoxemia.  Left shoulder pain. EXAM: PORTABLE CHEST 1 VIEW COMPARISON:  Chest radiograph 01/07/2016 FINDINGS: Stable enlarged cardiac and  mediastinal contours. Heterogeneous opacities within the lung bases bilaterally. Small bilateral pleural effusions. Stable widening of the left AC joint and appearance of the distal left clavicle. Left shoulder joint degenerative changes. IMPRESSION: Cardiomegaly. Small bilateral pleural effusions and underlying opacities favored to represent atelectasis. Infection not excluded. In the setting of reported left shoulder pain, recommend dedicated evaluation with left shoulder radiographs. Electronically Signed   By: Annia Belt M.D.   On: 01/09/2016 20:04   Dg Shoulder Left  Result Date: 01/10/2016 CLINICAL DATA:  Swelling and left shoulder pain. EXAM: LEFT SHOULDER - 2+ VIEW COMPARISON:  None. FINDINGS: Portable study with limited positioning and two views. No evidence of acute fracture or dislocation. No notable degenerative spurring. No soft tissue calcification. Intrathoracic findings as seen on dedicated chest x-ray. IMPRESSION: Negative limited portable shoulder. Electronically Signed   By: Marnee Spring M.D.   On: 01/10/2016 03:28     Scheduled Meds: . [START ON 01/12/2016] carbidopa-levodopa  1.5 tablet Oral TID  . cephALEXin  250 mg Oral Q8H  . docusate sodium  100 mg Oral BID  . gabapentin  300 mg Oral QHS  . senna  1 tablet Oral BID  . sertraline  50 mg Oral Daily  . vitamin B-12  1,000 mcg Oral Daily  . Warfarin - Pharmacist Dosing Inpatient   Does not apply q1800   Continuous Infusions:     LOS: 4 days    Time spent: 30 min    Renae Fickle, MD [facility] Pager [phone]  If 7PM-7AM, please contact night-coverage [email or web address] Password [facility] 01/11/2016, 5:34 PM

## 2016-01-11 NOTE — Progress Notes (Signed)
ANTICOAGULATION CONSULT NOTE   Pharmacy Consult:  Coumadin Indication: atrial fibrillation and VTE prophylaxis post-op hip surgery  Allergies  Allergen Reactions  . Clonidine Derivatives     Reaction unknown  . Lisinopril     Reaction unknown  . Macrobid [Nitrofurantoin [employer] CorporationMonohyd Macro] Hives  . Sulfa Antibiotics     Reaction unknown  . Valsartan     Reaction unknown    Patient Measurements: Height: 5\' 7"  (170.2 cm) Weight: 181 lb 7 oz (82.3 kg) IBW/kg (Calculated) : 61.6  Vital Signs: Temp: 97.6 F (36.4 C) (08/24 0623) Temp Source: Oral (08/24 0623) BP: 151/62 (08/24 0623) Pulse Rate: 63 (08/24 0623)  Labs:  Recent Labs  01/09/16 0516 01/10/16 0343 01/11/16 0549  HGB 8.6* 8.6* 8.3*  HCT 28.5* 28.6* 27.1*  PLT 204 181 207  LABPROT 18.3* 26.2* 28.0*  INR 1.50 2.35 2.56  CREATININE 1.98* 2.30* 1.83*    Estimated Creatinine Clearance: 25.3 mL/min (by C-G formula based on SCr of 1.83 mg/dL).   Assessment: [age] YOF continues on Coumadin for history of Afib and remote DVT.  INR remains therapeutic post holding Coumadin yesterday.  No bleeding reported.   Goal of Therapy:  INR 2-3    Plan:  - Coumadin 1.5mg  PO today - Daily PT / INR   Aubrey Voong D. Laney Potashang, PharmD, BCPS Pager:  [phone] 01/11/2016, 2:04 PM

## 2016-01-11 NOTE — Progress Notes (Signed)
Speech Language Pathology Treatment: Dysphagia  Patient Details Name: Sheila Watkins MRN: [id number] DOB: 1929/06/11 Today's Date: 01/11/2016 Time: [id number] SLP Time Calculation (min) (ACUTE ONLY): 15 min  Assessment / Plan / Recommendation Clinical Impression  Pt seen for dysphagia followup. Pt continues to demonstrate decline this date in ability to take PO. Pt unable to demonstrate adequate suck to retrieve material from straw. Pt did not respond to oral bolus of puree and allowed it to spill from lips without attempting to contain or manipulate PO in anyway. Pt able to demonstrate swallow response when liquids provided via teaspoon, however pt refused any other PO despite max verbal encouragement. Pt minimally verbal this date.  Recommend: Change to full liquids. SLP to follow. Discussed concerns with RN who will discuss with physician.    HPI HPI: Ms. Sheila Watkins is an 82 year old female, with PMH listed below, that presented to the [facility] ED on 01/07/16 with c/o R hip pain.  She reports that she was at home and attempted to ambulate and fell.  She c/o of sharp R hip pain and was unable to bear any weight on her affected extremity.  Nonweightbearing and immobilization makes it feel best.  Radiographs of her R hip/pelvis were obtained and she was diagnosed with a R hip subcapital femoral neck fx.  She was then [facility] for [facility] care.  Dr. Toni ArthursJohn Hewitt was then consulted for definitive care.  She denies any previous R hip injury or surgery.  She resides with her son.  He is at the bedside with her this morning.  He states that she has been working with Physical Therapy to regain ambulatory status and strength at home.  She is a household ambulator with a walker.  She is currently NPO and her coumadin is being held.      SLP Plan  Continue with current plan of care     Recommendations  Diet recommendations:  (full liquids) Liquids provided via:  Straw Medication Administration: Crushed with puree Supervision: Trained caregiver to feed patient Compensations: Small sips/bites;Follow solids with liquid Postural Changes and/or Swallow Maneuvers: Seated upright 90 degrees;Upright 30-60 min after meal             Oral Care Recommendations: Oral care BID Follow up Recommendations: 24 hour supervision/assistance;Skilled Nursing facility Plan: Continue with current plan of care     GO                Joaquin CourtsKara E TurnerMA, CCC-SLP Pager [phone] 01/11/2016, 12:21 PM

## 2016-01-12 LAB — PROTIME-INR
INR: 2.62
Prothrombin Time: 28.5 seconds — ABNORMAL HIGH (ref 11.4–15.2)

## 2016-01-12 LAB — CBC
HCT: 27.8 % — ABNORMAL LOW (ref 36.0–46.0)
Hemoglobin: 8.5 g/dL — ABNORMAL LOW (ref 12.0–15.0)
MCH: 28.4 pg (ref 26.0–34.0)
MCHC: 30.6 g/dL (ref 30.0–36.0)
MCV: 93 fL (ref 78.0–100.0)
PLATELETS: 214 10*3/uL (ref 150–400)
RBC: 2.99 MIL/uL — ABNORMAL LOW (ref 3.87–5.11)
RDW: 17 % — AB (ref 11.5–15.5)
WBC: 7.7 10*3/uL (ref 4.0–10.5)

## 2016-01-12 LAB — BASIC METABOLIC PANEL
Anion gap: 8 (ref 5–15)
BUN: 21 mg/dL — AB (ref 6–20)
CHLORIDE: 111 mmol/L (ref 101–111)
CO2: 21 mmol/L — AB (ref 22–32)
CREATININE: 1.46 mg/dL — AB (ref 0.44–1.00)
Calcium: 8.5 mg/dL — ABNORMAL LOW (ref 8.9–10.3)
GFR calc non Af Amer: 32 mL/min — ABNORMAL LOW (ref >60)
GFR, EST AFRICAN AMERICAN: 37 mL/min — AB (ref >60)
GLUCOSE: 99 mg/dL (ref 65–99)
Potassium: 3.8 mmol/L (ref 3.5–5.1)
Sodium: 140 mmol/L (ref 135–145)

## 2016-01-12 MED ORDER — AMLODIPINE BESYLATE 5 MG PO TABS
5.0000 mg | ORAL_TABLET | Freq: Every day | ORAL | Status: DC
  Administered 2016-01-12 – 2016-01-13 (×2): 5 mg via ORAL
  Filled 2016-01-12 (×3): qty 1

## 2016-01-12 MED ORDER — WARFARIN SODIUM 2 MG PO TABS
2.0000 mg | ORAL_TABLET | Freq: Once | ORAL | Status: AC
  Administered 2016-01-12: 2 mg via ORAL
  Filled 2016-01-12: qty 1

## 2016-01-12 MED ORDER — FUROSEMIDE 20 MG PO TABS
20.0000 mg | ORAL_TABLET | Freq: Every day | ORAL | Status: DC
  Administered 2016-01-12 – 2016-01-13 (×2): 20 mg via ORAL
  Filled 2016-01-12 (×3): qty 1

## 2016-01-12 MED ORDER — POLYETHYLENE GLYCOL 3350 17 G PO PACK
17.0000 g | PACK | Freq: Every day | ORAL | Status: DC
  Administered 2016-01-12 – 2016-01-13 (×2): 17 g via ORAL
  Filled 2016-01-12 (×2): qty 1

## 2016-01-12 MED ORDER — WARFARIN 0.5 MG HALF TABLET: 1.5000 mg | ORAL_TABLET | Freq: Once | ORAL | Status: DC

## 2016-01-12 MED ORDER — ENSURE ENLIVE PO LIQD
237.0000 mL | Freq: Three times a day (TID) | ORAL | Status: DC
  Administered 2016-01-12 – 2016-01-13 (×3): 237 mL via ORAL

## 2016-01-12 MED ORDER — BISACODYL 10 MG RE SUPP
10.0000 mg | Freq: Once | RECTAL | Status: AC
Start: 2016-01-12 — End: 2016-01-12
  Administered 2016-01-12: 10 mg via RECTAL
  Filled 2016-01-12: qty 1

## 2016-01-12 NOTE — Progress Notes (Signed)
Physical Therapy Treatment Patient Details Name: Sheila Watkins MRN: [id number] DOB: 02/10/1930 Today's Date: 01/12/2016    History of Present Illness Patient is a 82 y/o female with hx of PD, HTN, DVT, DM, CKD and A-fib presents s/p right hip hemiarthoplasty after fall at home.    PT Comments    Pt performed improved OOB transfer with use of stedy frame.  Pt standing more erect and may be ready for pre-gait activities next session.  Pt more alert and following commands better.  Pt remains slow to process information.    Follow Up Recommendations  SNF     Equipment Recommendations  None recommended by PT    Recommendations for Other Services OT consult     Precautions / Restrictions Precautions Precautions: Fall Precaution Comments: direct anterior approach Restrictions Weight Bearing Restrictions: Yes RLE Weight Bearing: Weight bearing as tolerated    Mobility  Bed Mobility Overal bed mobility: Needs Assistance;+2 for physical assistance Bed Mobility: Supine to Sit     Supine to sit: Max assist;Mod assist     General bed mobility comments: Pt required max assist to advance LEs to edge of bed and mod assist +2 to elevate trunk into seated position.    Transfers Overall transfer level: Needs assistance Equipment used: None Transfers: Sit to/from Stand   Stand pivot transfers: Mod assist;+2 physical assistance       General transfer comment: Pt performed sit to stand from bed to stedy frame, mod +2 assist.  Pt then sat to pads on stedy and from higher seat height performed transfer with +1 mod assist.  Pt required cues for hand placement, for weight shifting and B hip extension.    Ambulation/Gait Ambulation/Gait assistance:  (unable.  But standing more erect during session.  )               Stairs            Wheelchair Mobility    Modified Rankin (Stroke Patients Only)       Balance Overall balance assessment: Needs assistance;History of  Falls Sitting-balance support: Bilateral upper extremity supported;Feet supported Sitting balance-Leahy Scale: Poor       Standing balance-Leahy Scale: Poor                      Cognition Arousal/Alertness: Awake/alert Behavior During Therapy: WFL for tasks assessed/performed Overall Cognitive Status: Within Functional Limits for tasks assessed         Following Commands: Follows one step commands with increased time            Exercises      General Comments        Pertinent Vitals/Pain Pain Assessment: Faces Faces Pain Scale: Hurts a little bit Pain Descriptors / Indicators: Grimacing Pain Intervention(s): Repositioned    Home Living                      Prior Function            PT Goals (current goals can now be found in the care plan section) Acute Rehab PT Goals Patient Stated Goal: son wants to get pt home if possible Potential to Achieve Goals: Fair Progress towards PT goals: Progressing toward goals    Frequency  Min 3X/week    PT Plan Current plan remains appropriate    Co-evaluation             End of Session Equipment Utilized During Treatment: Gait  belt Activity Tolerance: Patient limited by fatigue;Patient tolerated treatment well Patient left: in chair;with call bell/phone within reach;with chair alarm set;with family/visitor present     Time: [id number] PT Time Calculation (min) (ACUTE ONLY): 18 min  Charges:  $Therapeutic Activity: 8-22 mins                    G Codes:      Florestine Aversimee J Yukiko Minnich 01/12/2016, 12:59 PM  Joycelyn RuaAimee Ashby Leflore, PTA pager [phone]\

## 2016-01-12 NOTE — Progress Notes (Signed)
Speech Language Pathology Treatment: Dysphagia  Patient Details Name: Sheila Watkins MRN: [id number] DOB: Sep 03, 1929 Today's Date: 01/12/2016 Time: 1550-1606 SLP Time Calculation (min) (ACUTE ONLY): 16 min  Assessment / Plan / Recommendation Clinical Impression  Pt seen for dysphagia followup. Improved interaction with mod A this date. Pt tolerated trials of thin and puree without s/s aspiration. No efforts made to manipulate or masticate solids even when provided with a solid that was highly desirable for the pt. Recommend: Dys 1 with thin. 1:1 assist with all PO. Speech therapy to follow during acute stay and would be beneficial at SNF level to liberalize diet consistency as able.    HPI HPI: Sheila Watkins is an 82 year old female, with PMH listed below, that presented to the [facility] ED on 01/07/16 with c/o R hip pain.  She reports that she was at home and attempted to ambulate and fell.  She c/o of sharp R hip pain and was unable to bear any weight on her affected extremity.  Nonweightbearing and immobilization makes it feel best.  Radiographs of her R hip/pelvis were obtained and she was diagnosed with a R hip subcapital femoral neck fx.  She was then [facility] for definitive care.  Sheila Watkins was then consulted for definitive care.  She denies any previous R hip injury or surgery.  She resides with her son.  He is at the bedside with her this morning.  He states that she has been working with Physical Therapy to regain ambulatory status and strength at home.  She is a household ambulator with a walker.  She is currently NPO and her coumadin is being held.      SLP Plan  Continue with current plan of care     Recommendations  Diet recommendations: Dysphagia 1 (puree);Thin liquid Liquids provided via: Straw Medication Administration: Crushed with puree Supervision: Trained caregiver to feed patient Compensations: Small sips/bites;Follow solids with  liquid Postural Changes and/or Swallow Maneuvers: Seated upright 90 degrees;Upright 30-60 min after meal             Oral Care Recommendations: Oral care BID Follow up Recommendations: 24 hour supervision/assistance;Skilled Nursing facility Plan: Continue with current plan of care     GO                Rocky CraftsKara E Darriana Deboy MA, CCC-SLP Pager [phone] 01/12/2016, 4:12 PM

## 2016-01-12 NOTE — Care Management Important Message (Signed)
Important Message  Patient Details  Name: Sheila Watkins MRN: [id number] Date of Birth: 1929/10/11   Medicare Important Message Given:  Yes    Sharisse Rantz 01/12/2016, 11:10 AM

## 2016-01-12 NOTE — Progress Notes (Signed)
Patient refused CPAP tonight. There Isn't a machine in the room at this time. RN aware. Explained to Patient that if they changed their mind, to just have the RN call Respiratory and we would come set them up. 

## 2016-01-12 NOTE — Progress Notes (Signed)
Initial Nutrition Assessment  DOCUMENTATION CODES:   Not applicable  INTERVENTION:  Provide Ensure Enlive po TID, each supplement provides 350 kcal and 20 grams of protein.  Encourage adequate PO intake.   NUTRITION DIAGNOSIS:   Inadequate oral intake related to lethargy/confusion, poor appetite as evidenced by meal completion < 25%.  GOAL:   Patient will meet greater than or equal to 90% of their needs  MONITOR:   PO intake, Supplement acceptance, Labs, Weight trends, Skin, I & O's  REASON FOR ASSESSMENT:   Consult Assessment of nutrition requirement/status  ASSESSMENT:   82 y.o. female, with Pafib, chad2svasc=5, Parkinsons,  Presents with c/o fall while at home getting out of chair. Mechanical fall.   Presented to [facility]. In ED,  Found to have R hip fracture.  Pt transported to [facility] for [facility].  Dr. Victorino DikeHewitt performed right hip hemiarthroplasty on 8/21.  Diet has recently been changed to a dysphagia 1 with thin liquids. Meal completion has been 10-25% since admission. Pt was asleep during time of visit and did not wake. Son at bedside. He reports pt was eating well PTA with no other difficulties. Pt currently has Ensure ordered. Son educated on the importance of protein and caloric intake at meals. RD to modify orders to provide Ensure TID to aid in caloric and protein needs.   Pt with no observed significant fat or muscle mass loss.  Labs and medications reviewed.    Diet Order:  DIET - DYS 1 Room service appropriate? Yes; Fluid consistency: Thin  Skin:  Wound (see comment) (Diabetic ulcer R foot, incision R hip)  Last BM:  8/20  Height:   Ht Readings from Last 1 Encounters:  01/08/16 5\' 7"  (1.702 m)    Weight:   Wt Readings from Last 1 Encounters:  01/11/16 181 lb 7 oz (82.3 kg)    Ideal Body Weight:  61.36 kg  BMI:  Body mass index is 28.42 kg/m.  Estimated Nutritional Needs:   Kcal:  1700-1850  Protein:   75-85 grams  Fluid:  1.7 - 1.8 L/day  EDUCATION NEEDS:   Education needs addressed  Roslyn SmilingStephanie Marae Cottrell, MS, RD, LDN Pager # [phone] After hours/ weekend pager # [phone]

## 2016-01-12 NOTE — Progress Notes (Addendum)
PROGRESS NOTE  Sheila Watkins  ZOX:[id number] DOB: Dec 27, 1929 DOA: 01/07/2016 PCP: Mauricia Area, MD  Brief Narrative:  [name] 82 y.o.female,with Pafib, chad2svasc=5, Parkinsons, Presents with c/o fall while at home getting out of chair. Mechanical fall. Presented to [facility] for evaluation. In ED, Found to have R hip fracture. Pt transported to [facility] for evaluation. Dr. Victorino Dike performed right hip hemiarthroplasty on 8/21.  Post-operative course has been complicated by poor oral intake, generalized weakness after surgery.    Assessment & Plan:   Active Problems:   Hip fracture, right (HCC)   Chronic atrial fibrillation (HCC)   Femoral neck fracture (HCC)   Femoral neck fracture, right, closed, initial encounter   Anemia of chronic disease   Chronic kidney disease (CKD), stage IV (severe) (HCC)   Parkinson disease (HCC)   Displaced right femoral neck fracture: Repaired on 01/08/2015. POD#4 - Management per orthopedics - warfarin resumed  - Continue hydrocodone-APAP prn  Post-operative blood loss anemia superimposed on chronic iron and borderline vitamin B12 deficiency -  feraheme given on 8/24 -  Resume oral iron supplementation -  Continue vitamin b12 supplementation  Acute hypoxic respiratory failure likely due to acute on chronic diastolic heart failure and atelectasis -  Resume lasix -  OOB -  Incentive spirometry  Parkinson's: Advanced.  Case discussed with her primary Neurologist, Dr. Antonietta Barcelona - Increased Sinemet on 8/24 - Stopped ropinirole per Dr. Larina Earthly recommendation.  Can be discussed with him over the phone when she arrives at SNF.   - PT recommending SNF - Continue Neurontin  Dysphagia and poor oral intake -  appreciate SLP assistance, assessing daily -  Advance to dysphagia 1 diet since looking better this morning while awaiting SLP reevaluation -  Nutrition consultation -  Continue supplements  Acute on CKD:  Creatinine improved with IVF - BMP in a.m.  Chronic atrial fibrillation and WJX:[id number] = 6. -  Rate controlled - Anticoagulation resumed  Depression: - Continue Zoloft, Requip  Right lower extremity cellulitis: Marked improvement since admission. - DC IV vancomycin. 01/08/2016>>01/09/2016 - continue Keflex. 01/09/16 >> 8/27  Hypotension: resolved - CXR:  Bilateral trace effusions and opacities c/w atelectasis - Echo:  Grade 2 DD with severely dilated left atrium and moderately dilated right atrium.  PA peak pressure 57 mm Hg  OSA, attempt CPAP application  DVT prophylaxis: coumadin Code Status: FULL.  Family brought in living will/advanced directives.   Family Communication: Sons at bedside Disposition Plan:  SNF placement likely tomorrow.  Will wean oxygen, advance diet today, and get OOB.    Consultants:   ortho   Procedures:   R fem fracture repair 01/08/16  Antimicrobials:  Vancomycin 8/21>>8/22  Subjective:  More awake, having pain in her right leg but moreso in her left hand where her IV is.    Objective: Vitals:   01/11/16 1500 01/11/16 2200 01/12/16 0549 01/12/16 1500  BP: (!) 155/59 (!) 140/49 129/70 132/74  Pulse: 66 (!) 55 98 78  Resp: 20 20 18 18   Temp: 99.1 F (37.3 C) 98.2 F (36.8 C) 97.7 F (36.5 C) 97.6 F (36.4 C)  TempSrc: Oral Oral Axillary Oral  SpO2: 100% 97% 100% 100%  Weight:      Height:        Intake/Output Summary (Last 24 hours) at 01/12/16 1529 Last data filed at 01/12/16 1500  Gross per 24 hour  Intake  60 ml  Output                0 ml  Net               60 ml   Filed Weights   01/08/16 1544 01/10/16 0500 01/11/16 0500  Weight: 78 kg (172 lb) 82.6 kg (182 lb 1.6 oz) 82.3 kg (181 lb 7 oz)    Examination:  General exam:  Adult female.  Coughing up sputum and leftover medication residue.  Slumped to the left side of the bed but awake and alert.  Slow to respond to questions, but able to say a  few words today.   HEENT:  NCAT, MMM Respiratory system:  Rales in dependent areas, faint wheeze, rhonchorous Cardiovascular system: Regular rate and rhythm, normal S1/S2. No murmurs, rubs, gallops or clicks.  Warm extremities Gastrointestinal system: Normal active bowel sounds, soft, nondistended, nontender. MSK:  Normal tone and bulk, right lower extremity edema,  Bandage along anterolateral hip is clean/dry/intact.  Swelling present, but minimal bruising and no palpable hematoma Neuro:  Flat affect. Cogwheel rigidity    Data Reviewed: I have personally reviewed following labs and imaging studies  CBC:  Recent Labs Lab 01/07/16 1600 01/08/16 0539 01/08/16 2050 01/09/16 0516 01/10/16 0343 01/11/16 0549 01/12/16 0642  WBC 7.6 10.5 11.8* 9.1 8.7 8.2 7.7  NEUTROABS 6.6 9.6*  --   --   --   --   --   HGB 10.4* 10.9* 10.4* 8.6* 8.6* 8.3* 8.5*  HCT 32.9* 34.9* 34.5* 28.5* 28.6* 27.1* 27.8*  MCV 92.9 92.3 93.0 93.1 93.8 92.8 93.0  PLT 232 267 223 204 181 207 214   Basic Metabolic Panel:  Recent Labs Lab 01/08/16 0539 01/08/16 2050 01/09/16 0516 01/10/16 0343 01/11/16 0549 01/12/16 0642  NA 141  --  138 137 138 140  K 3.8  --  3.9 3.9 3.7 3.8  CL 108  --  108 109 109 111  CO2 24  --  24 22 22  21*  GLUCOSE 144*  --  135* 131* 114* 99  BUN 29*  --  25* 32* 27* 21*  CREATININE 1.57* 1.64* 1.98* 2.30* 1.83* 1.46*  CALCIUM 9.0  --  8.2* 8.0* 8.4* 8.5*   GFR: Estimated Creatinine Clearance: 31.7 mL/min (by C-G formula based on SCr of 1.46 mg/dL). Liver Function Tests:  Recent Labs Lab 01/08/16 0539  AST 26  ALT 12*  ALKPHOS 105  BILITOT 0.8  PROT 7.3  ALBUMIN 4.2   No results for input(s): LIPASE, AMYLASE in the last 168 hours. No results for input(s): AMMONIA in the last 168 hours. Coagulation Profile:  Recent Labs Lab 01/08/16 0539 01/09/16 0516 01/10/16 0343 01/11/16 0549 01/12/16 0642  INR 1.23 1.50 2.35 2.56 2.62   Cardiac Enzymes: No results for  input(s): CKTOTAL, CKMB, CKMBINDEX, TROPONINI in the last 168 hours. BNP (last 3 results) No results for input(s): PROBNP in the last 8760 hours. HbA1C: No results for input(s): HGBA1C in the last 72 hours. CBG:  Recent Labs Lab 01/08/16 1844 01/10/16 0614 01/10/16 1634 01/10/16 2117 01/11/16 0645  GLUCAP 121* 166* 130* 120* 128*   Lipid Profile: No results for input(s): CHOL, HDL, LDLCALC, TRIG, CHOLHDL, LDLDIRECT in the last 72 hours. Thyroid Function Tests: No results for input(s): TSH, T4TOTAL, FREET4, T3FREE, THYROIDAB in the last 72 hours. Anemia Panel:  Recent Labs  01/10/16 0343  VITAMINB12 227  FOLATE 17.8  FERRITIN 146  TIBC 200*  IRON  13*  RETICCTPCT 2.6   Urine analysis: No results found for: COLORURINE, APPEARANCEUR, LABSPEC, PHURINE, GLUCOSEU, HGBUR, BILIRUBINUR, KETONESUR, PROTEINUR, UROBILINOGEN, NITRITE, LEUKOCYTESUR Sepsis Labs: @LABRCNTIP (procalcitonin:4,lacticidven:4)  ) Recent Results (from the past 240 hour(s))  MRSA PCR Screening     Status: None   Collection Time: 01/08/16  2:56 AM  Result Value Ref Range Status   MRSA by PCR NEGATIVE NEGATIVE Final    Comment:        The GeneXpert MRSA Assay (FDA approved for NASAL specimens only), is one component of a comprehensive MRSA colonization surveillance program. It is not intended to diagnose MRSA infection nor to guide or monitor treatment for MRSA infections.   Culture, blood (routine x 2)     Status: None (Preliminary result)   Collection Time: 01/08/16  9:20 AM  Result Value Ref Range Status   Specimen Description BLOOD LEFT HAND  Final   Special Requests IN PEDIATRIC BOTTLE 1 CC  Final   Culture   Final    NO GROWTH 3 DAYS Performed at [facility]    Report Status PENDING  Incomplete  Culture, blood (routine x 2)     Status: None (Preliminary result)   Collection Time: 01/08/16  9:29 AM  Result Value Ref Range Status   Specimen Description BLOOD LEFT HAND  Final    Special Requests BOTTLES DRAWN AEROBIC ONLY 1 CC  Final   Culture   Final    NO GROWTH 3 DAYS Performed at [facility]    Report Status PENDING  Incomplete      Radiology Studies: No results found.   Scheduled Meds: . amLODipine  5 mg Oral Daily  . bisacodyl  10 mg Rectal Once  . carbidopa-levodopa  1.5 tablet Oral TID  . cephALEXin  250 mg Oral Q8H  . docusate sodium  100 mg Oral BID  . feeding supplement (ENSURE ENLIVE)  237 mL Oral TID BM  . furosemide  20 mg Oral Daily  . gabapentin  300 mg Oral QHS  . polyethylene glycol  17 g Oral Daily  . senna  1 tablet Oral BID  . sertraline  50 mg Oral Daily  . vitamin B-12  1,000 mcg Oral Daily  . warfarin  2 mg Oral ONCE-1800  . Warfarin - Pharmacist Dosing Inpatient   Does not apply q1800   Continuous Infusions:     LOS: 5 days    Time spent: 30 min    Renae FickleSHORT, Luanne Krzyzanowski, MD [facility] Pager [phone]  If 7PM-7AM, please contact night-coverage [email or web address] Password [email or web address] 01/12/2016, 3:29 PM

## 2016-01-12 NOTE — Progress Notes (Signed)
ANTICOAGULATION CONSULT NOTE   Pharmacy Consult:  Coumadin Indication: atrial fibrillation and VTE prophylaxis post-op hip surgery  Allergies  Allergen Reactions  . Clonidine Derivatives     Reaction unknown  . Lisinopril     Reaction unknown  . Macrobid [Nitrofurantoin [employer]yd Macro] Hives  . Sulfa Antibiotics     Reaction unknown  . Valsartan     Reaction unknown    Patient Measurements: Height: 5\' 7"  (170.2 cm) Weight: 181 lb 7 oz (82.3 kg) IBW/kg (Calculated) : 61.6  Vital Signs: Temp: 97.7 F (36.5 C) (08/25 0549) Temp Source: Axillary (08/25 0549) BP: 129/70 (08/25 0549) Pulse Rate: 98 (08/25 0549)  Labs:  Recent Labs  01/10/16 0343 01/11/16 0549 01/12/16 0642  HGB 8.6* 8.3* 8.5*  HCT 28.6* 27.1* 27.8*  PLT 181 207 214  LABPROT 26.2* 28.0* 28.5*  INR 2.35 2.56 2.62  CREATININE 2.30* 1.83* 1.46*    Estimated Creatinine Clearance: 31.7 mL/min (by C-G formula based on SCr of 1.46 mg/dL).   Assessment: [age] YOF continues on Coumadin for history of Afib and remote DVT.  INR remains therapeutic after missing/holding dose on 8/22 and 8/23. No bleeding reported.     Goal of Therapy:  INR 2-3    Plan:  - Coumadin 2 mg PO today - Daily PT / INR   Pollyann SamplesAndy Remmie Bembenek, PharmD, BCPS 01/12/2016, 9:08 AM Pager: [phone]

## 2016-01-12 NOTE — Progress Notes (Signed)
   Subjective:  Patient reports pain as mild to moderate.  No c/o.  Objective:   VITALS:   Vitals:   01/11/16 0623 01/11/16 1500 01/11/16 2200 01/12/16 0549  BP: (!) 151/62 (!) 155/59 (!) 140/49 129/70  Pulse: 63 66 (!) 55 98  Resp: 20 20 20 18   Temp: 97.6 F (36.4 C) 99.1 F (37.3 C) 98.2 F (36.8 C) 97.7 F (36.5 C)  TempSrc: Oral Oral Oral Axillary  SpO2: 96% 100% 97% 100%  Weight:      Height:        ABD soft Sensation intact distally Intact pulses distally Dorsiflexion/Plantar flexion intact Incision: dressing C/D/I Compartment soft R ankle cellulitis improving   Lab Results  Component Value Date   WBC 7.7 01/12/2016   HGB 8.5 (L) 01/12/2016   HCT 27.8 (L) 01/12/2016   MCV 93.0 01/12/2016   PLT 214 01/12/2016   BMET    Component Value Date/Time   NA 140 01/12/2016 0642   K 3.8 01/12/2016 0642   CL 111 01/12/2016 0642   CO2 21 (L) 01/12/2016 0642   GLUCOSE 99 01/12/2016 0642   BUN 21 (H) 01/12/2016 0642   CREATININE 1.46 (H) 01/12/2016 0642   CALCIUM 8.5 (L) 01/12/2016 0642   GFRNONAA 32 (L) 01/12/2016 0642   GFRAA 37 (L) 01/12/2016 [id number]     Assessment/Plan: 4 Days Post-Op   Active Problems:   Hip fracture, right (HCC)   Chronic atrial fibrillation (HCC)   Femoral neck fracture (HCC)   Femoral neck fracture, right, closed, initial encounter   Anemia of chronic disease   Chronic kidney disease (CKD), stage IV (severe) (HCC)   Parkinson disease (HCC)   WBAT with walker DVT ppx: coumadin with lovenox bridge R ankle cellulitis per primary team (present preop) PT/OT Dispo: continue mobilizing with therapy, can likely d/c home with HHPT depending on progress, please discuss d/c plan with Chong SicilianJill Lauer, RN [facility]    Bobbie Virden, Cloyde ReamsBrian James 01/12/2016, 8:08 AM   Samson FredericBrian Bereket Gernert, MD Cell [phone]

## 2016-01-13 DIAGNOSIS — R1310 Dysphagia, unspecified: Secondary | ICD-10-CM

## 2016-01-13 DIAGNOSIS — S72001A Fracture of unspecified part of neck of right femur, initial encounter for closed fracture: Secondary | ICD-10-CM

## 2016-01-13 LAB — CULTURE, BLOOD (ROUTINE X 2)
CULTURE: NO GROWTH
Culture: NO GROWTH

## 2016-01-13 LAB — CBC
HCT: 31.1 % — ABNORMAL LOW (ref 36.0–46.0)
Hemoglobin: 9.5 g/dL — ABNORMAL LOW (ref 12.0–15.0)
MCH: 28.4 pg (ref 26.0–34.0)
MCHC: 30.5 g/dL (ref 30.0–36.0)
MCV: 93.1 fL (ref 78.0–100.0)
PLATELETS: 266 10*3/uL (ref 150–400)
RBC: 3.34 MIL/uL — AB (ref 3.87–5.11)
RDW: 16.9 % — AB (ref 11.5–15.5)
WBC: 8.2 10*3/uL (ref 4.0–10.5)

## 2016-01-13 LAB — PROTIME-INR
INR: 2.82
PROTHROMBIN TIME: 30.2 s — AB (ref 11.4–15.2)

## 2016-01-13 LAB — BASIC METABOLIC PANEL
Anion gap: 12 (ref 5–15)
BUN: 20 mg/dL (ref 6–20)
CALCIUM: 9.1 mg/dL (ref 8.9–10.3)
CO2: 23 mmol/L (ref 22–32)
CREATININE: 1.41 mg/dL — AB (ref 0.44–1.00)
Chloride: 106 mmol/L (ref 101–111)
GFR calc non Af Amer: 33 mL/min — ABNORMAL LOW (ref >60)
GFR, EST AFRICAN AMERICAN: 38 mL/min — AB (ref >60)
Glucose, Bld: 106 mg/dL — ABNORMAL HIGH (ref 65–99)
Potassium: 3.6 mmol/L (ref 3.5–5.1)
SODIUM: 141 mmol/L (ref 135–145)

## 2016-01-13 MED ORDER — ENSURE ENLIVE PO LIQD: 237.0000 mL | Freq: Three times a day (TID) | ORAL | 12 refills | Status: AC

## 2016-01-13 MED ORDER — CYANOCOBALAMIN 1000 MCG PO TABS
1000.0000 ug | ORAL_TABLET | Freq: Every day | ORAL | 0 refills | Status: AC
Start: 2016-01-13 — End: ?

## 2016-01-13 MED ORDER — AMLODIPINE BESYLATE 5 MG PO TABS: 5.0000 mg | ORAL_TABLET | Freq: Every day | ORAL | 0 refills | Status: AC

## 2016-01-13 MED ORDER — SENNA 8.6 MG PO TABS: 1.0000 | ORAL_TABLET | Freq: Two times a day (BID) | ORAL | 0 refills | Status: AC

## 2016-01-13 MED ORDER — HYDROCODONE-ACETAMINOPHEN 5-325 MG PO TABS: 1.0000 | ORAL_TABLET | Freq: Four times a day (QID) | ORAL | 0 refills | Status: AC | PRN

## 2016-01-13 MED ORDER — POLYETHYLENE GLYCOL 3350 17 G PO PACK: 17.0000 g | PACK | Freq: Every day | ORAL | 0 refills | Status: AC

## 2016-01-13 MED ORDER — CARBIDOPA-LEVODOPA 25-100 MG PO TABS: 1.5000 | ORAL_TABLET | Freq: Three times a day (TID) | ORAL | 0 refills | Status: AC

## 2016-01-13 MED ORDER — DOCUSATE SODIUM 100 MG PO CAPS: 100.0000 mg | ORAL_CAPSULE | Freq: Two times a day (BID) | ORAL | 0 refills | Status: AC

## 2016-01-13 MED ORDER — FUROSEMIDE 20 MG PO TABS
20.0000 mg | ORAL_TABLET | Freq: Every day | ORAL | 0 refills | Status: AC
Start: 2016-01-13 — End: ?

## 2016-01-13 MED ORDER — ALPRAZOLAM 0.25 MG PO TABS: 0.2500 mg | ORAL_TABLET | Freq: Every evening | ORAL | 0 refills | Status: AC | PRN

## 2016-01-13 MED ORDER — GABAPENTIN 300 MG PO CAPS: 300.0000 mg | ORAL_CAPSULE | Freq: Every day | ORAL | 0 refills | Status: AC

## 2016-01-13 MED ORDER — FERROUS SULFATE 325 (65 FE) MG PO TBEC: 325.0000 mg | DELAYED_RELEASE_TABLET | Freq: Every day | ORAL | 0 refills | Status: AC

## 2016-01-13 MED ORDER — WARFARIN SODIUM 2 MG PO TABS
2.0000 mg | ORAL_TABLET | Freq: Once | ORAL | Status: DC
  Filled 2016-01-13: qty 1

## 2016-01-13 NOTE — Discharge Summary (Addendum)
Physician Discharge Summary  Quinlynn Cuthbert Steffek ZOX:[id number] DOB: Nov 03, 1929 DOA: 01/07/2016  PCP: Mauricia Area, MD  Admit date: 01/07/2016 Discharge date: 01/16/2016  Admitted From: home  Disposition:  SNF  Recommendations for Outpatient Follow-up:  1. Follow up with Dr. Linna Caprice 2 weeks 2. Please obtain BMP/CBC in one week 3. Ongoing PT/OT and speech therapy 4. Palliative care consult 5. INR in 5 days  Equipment/Devices:  Weight bearing as tolerated right lower extremity  Discharge Condition:  Stable, improved CODE STATUS:  full  Diet recommendation:    Diet recommendations: Dysphagia 1 (puree);Thin liquid Liquids provided via: Straw Medication Administration: Crushed with puree Supervision: Trained caregiver to feed patient Compensations: Small sips/bites;Follow solids with liquid Postural Changes and/or Swallow Maneuvers: Seated upright 90 degrees;Upright 30-60 min after meal  Brief/Interim Summary:  [name] 82 y.o.femalewith paroxysmal atrial fibrillation on warfarin, Parkinson's disease, deconditioned at baseline who presented with a fall after trying to ambulate without her son's assistance.  Presented to [facility] for evaluation and found to have RIGHT hip fracture. Pt transported to [facility] for evaluation. Dr. Victorino Dike performed right hip hemiarthroplasty on 8/21.  Post-operative course was been complicated by poor oral intake, generalized weakness after surgery.  Anticipate a slow recovery given her poor functional status before her fall and her Parkinson's disease.  Family aware of guarded prognosis.      Discharge Diagnoses:  Principal Problem:   Femoral neck fracture (HCC) Active Problems:   Hip fracture, right (HCC)   Chronic atrial fibrillation (HCC)   Femoral neck fracture, right, closed, initial encounter   Anemia of chronic disease   Chronic kidney disease (CKD), stage IV (severe) (HCC)   Parkinson disease (HCC)   Postoperative  anemia due to acute blood loss  Displaced right femoral neck fracture: Repaired on 01/08/2015.  - warfarin resumed  - Continue hydrocodone-APAP prn (chronic home medication) - WBAT RLE - Keep dressing clean, dry, and intact.  Replace prn. - Follow up with orthopedic surgery in 2 weeks  Post-operative acute blood loss anemia superimposed on chronic iron and borderline vitamin B12 deficiency -  feraheme given on 8/24 -  Oral iron supplementation -  Vitamin b12 supplementation  Acute hypoxic respiratory failure likely due to acute on chronic diastolic heart failure and atelectasis, resolved by getting up to chair, attempting incentive spirometry, and resuming some lasix.  Home lasix dose was decreased from 20-40mg  daily to 20mg  daily due to poor oral intake.   Parkinson's: Advanced.  Case discussed with her primary Neurologist, Dr. Antonietta Barcelona - Increased Sinemet on 8/24 from 1 tab TID to 1.5 tabs TID - Stopped ropinirole per Dr. Larina Earthly recommendation.  Can be discussed with him over the phone when she [facility].  Prior to admission, she was taking 3mg  at breakfast, 3mg  at lunch, and 6mg  at dinner - PT recommending SNF - Continued nightly Neurontin  Dysphagia and poor oral intake -  appreciate SLP assistance:  Recommending ongoing speech therapy assessments to advance her diet safely at SNF -  Advanced to dysphagia 1 diet  -  Nutrition consultation -  Continue supplements  Acute on CKD stage III/IV: Creatinine trended down to baseline 1.4 with IVF.  Stopped her ACEI and ARB for now.  Creatinine stable after resuming her lasix.    Chronic atrial fibrillation and WJX:[id number] = 6. -  Rate controlled -  INR therapeutic  Depression, stable, continued Zoloft, Requip  Right lower extremity cellulitis: Marked improvement since admission.   -  IV vancomycin. 01/08/2016>>01/09/2016 - Keflex. 01/09/16 >> 8/26  Postoperative hypotension: resolved with IVF - CXR:  Bilateral trace  effusions and opacities c/w atelectasis - Echo:  Grade 2 DD with severely dilated left atrium and moderately dilated right atrium.  PA peak pressure 57 mm Hg - Stopped her losartan and ACEI  OSA, nightly CPAP  Discharge Instructions  Discharge Instructions    (HEART FAILURE PATIENTS) Call MD:  Anytime you have any of the following symptoms: 1) 3 pound weight gain in 24 hours or 5 pounds in 1 week 2) shortness of breath, with or without a dry hacking cough 3) swelling in the hands, feet or stomach 4) if you have to sleep on extra pillows at night in order to breathe.    Complete by:  As directed   Call MD for:  difficulty breathing, headache or visual disturbances    Complete by:  As directed   Call MD for:  extreme fatigue    Complete by:  As directed   Call MD for:  hives    Complete by:  As directed   Call MD for:  persistant dizziness or light-headedness    Complete by:  As directed   Call MD for:  persistant nausea and vomiting    Complete by:  As directed   Call MD for:  redness, tenderness, or signs of infection (pain, swelling, redness, odor or green/yellow discharge around incision site)    Complete by:  As directed   Call MD for:  severe uncontrolled pain    Complete by:  As directed   Call MD for:  temperature >100.4    Complete by:  As directed   Diet - low sodium heart healthy    Complete by:  As directed   Increase activity slowly    Complete by:  As directed       Medication List    STOP taking these medications   amlodipine-benazepril 2.5-10 MG capsule Commonly known as:  LOTREL   HYDROcodone-acetaminophen 10-325 MG tablet Commonly known as:  NORCO Replaced by:  HYDROcodone-acetaminophen 5-325 MG tablet   losartan 50 MG tablet Commonly known as:  COZAAR   rOPINIRole 3 MG tablet Commonly known as:  REQUIP     TAKE these medications   acetaminophen 500 MG tablet Commonly known as:  TYLENOL Take 500 mg by mouth every 6 (six) hours as needed for mild pain.    ALPRAZolam 0.25 MG tablet Commonly known as:  XANAX Take 1 tablet (0.25 mg total) by mouth at bedtime as needed for anxiety or sleep. What changed:  reasons to take this   amLODipine 5 MG tablet Commonly known as:  NORVASC Take 1 tablet (5 mg total) by mouth daily.   carbidopa-levodopa 25-100 MG tablet Commonly known as:  SINEMET IR Take 1.5 tablets by mouth 3 (three) times daily. What changed:  how much to take   cyanocobalamin 1000 MCG tablet Take 1 tablet (1,000 mcg total) by mouth daily.   docusate sodium 100 MG capsule Commonly known as:  COLACE Take 1 capsule (100 mg total) by mouth 2 (two) times daily.   feeding supplement (ENSURE ENLIVE) Liqd Take 237 mLs by mouth 3 (three) times daily between meals.   ferrous sulfate 325 (65 FE) MG EC tablet Take 1 tablet (325 mg total) by mouth daily with breakfast.   furosemide 20 MG tablet Commonly known as:  LASIX Take 1 tablet (20 mg total) by mouth daily. What changed:  how much  to take  additional instructions   gabapentin 300 MG capsule Commonly known as:  NEURONTIN Take 1 capsule (300 mg total) by mouth at bedtime. What changed:  medication strength  how much to take   HYDROcodone-acetaminophen 5-325 MG tablet Commonly known as:  NORCO/VICODIN Take 1 tablet by mouth every 6 (six) hours as needed for moderate pain. Replaces:  HYDROcodone-acetaminophen 10-325 MG tablet   MYRBETRIQ 50 MG Tb24 tablet Generic drug:  mirabegron ER Take 50 mg by mouth daily.   polyethylene glycol packet Commonly known as:  MIRALAX / GLYCOLAX Take 17 g by mouth daily.   senna 8.6 MG Tabs tablet Commonly known as:  SENOKOT Take 1 tablet (8.6 mg total) by mouth 2 (two) times daily.   sertraline 50 MG tablet Commonly known as:  ZOLOFT Take 50 mg by mouth daily.   warfarin 2 MG tablet Commonly known as:  COUMADIN Take 1-4 mg by mouth daily. 2 tablets on Sunday, wed, and Friday night. Half a tablet on all other days.       Follow-up Information    Swinteck, Cloyde ReamsBrian James, MD. Schedule an appointment as soon as possible for a visit in 2 week(s).   Specialty:  Orthopedic Surgery Why:  For wound re-check Contact information: [address] [place] [address] [phone]        Curt BearsNUZI,LIRIM, MD. Schedule an appointment as soon as possible for a visit in 1 month(s).   Specialty:  Neurology Contact information: [address] [place] [address] [phone]        Mauricia AreaSELTZER, BARRY R, MD Follow up today.   Specialty:  Oncology Why:  as needed Contact information: [address] [place] [address] [phone]          Allergies  Allergen Reactions  . Clonidine Derivatives     Reaction unknown  . Lisinopril     Reaction unknown  . Macrobid [Nitrofurantoin [employer]yd Macro] Hives  . Sulfa Antibiotics     Reaction unknown  . Valsartan     Reaction unknown    Consultations: Orthopedic surgery, Dr. Linna CapriceSwinteck   Procedures/Studies: Dg Chest 1 View  Result Date: 01/07/2016 CLINICAL DATA:  Fall today with right elbow pain, initial encounter EXAM: CHEST 1 VIEW COMPARISON:  03/17/2015 FINDINGS: Cardiac shadow is enlarged. Aortic calcifications are again seen and stable. The lungs are well aerated bilaterally. No rib fractures are seen. IMPRESSION: No acute abnormality noted. Electronically Signed   By: Alcide CleverMark  Lukens M.D.   On: 01/07/2016 17:48   Dg Elbow Complete Right  Result Date: 01/07/2016 CLINICAL DATA:  Fall with right elbow pain, initial encounter EXAM: RIGHT ELBOW - COMPLETE 3+ VIEW COMPARISON:  None. FINDINGS: Soft tissue swelling is noted over the proximal ulna. No acute fracture or dislocation is noted. No joint effusion is seen. IMPRESSION: Soft tissue swelling without acute bony abnormality. Electronically Signed   By: Alcide CleverMark  Lukens M.D.   On: 01/07/2016 17:49   Ct Head Wo Contrast  Result Date: 01/07/2016 CLINICAL DATA:  Larey SeatFell from wheelchair.  Trauma to the head and neck. Confusion. EXAM: CT HEAD WITHOUT CONTRAST CT CERVICAL SPINE WITHOUT CONTRAST TECHNIQUE: Multidetector CT imaging of the head and cervical spine was performed following the standard protocol without intravenous contrast. Multiplanar CT image reconstructions of the cervical spine were also generated. COMPARISON:  None. 11/30/2014 FINDINGS: CT HEAD FINDINGS There is generalized atrophy. There chronic small-vessel ischemic changes affecting the cerebral hemispheric white matter, thalami I and pons.  No sign of acute infarction. No mass lesion, hemorrhage, hydrocephalus or extra-axial collection. No skull fracture. No traumatic fluid in the sinuses, middle ears or mastoids. There is atherosclerotic calcification of the major vessels at the base of the brain. CT CERVICAL SPINE FINDINGS No fracture. No malalignment. No soft tissue swelling. Ordinary chronic spondylosis and facet arthropathy, typical of age. IMPRESSION: Head CT: No acute or traumatic finding. Atrophy and chronic small vessel disease. Cervical spine CT: No acute or traumatic finding. Ordinary degenerative spondylosis. Electronically Signed   By: Paulina Fusi M.D.   On: 01/07/2016 16:51   Ct Cervical Spine Wo Contrast  Result Date: 01/07/2016 CLINICAL DATA:  Larey Seat from wheelchair. Trauma to the head and neck. Confusion. EXAM: CT HEAD WITHOUT CONTRAST CT CERVICAL SPINE WITHOUT CONTRAST TECHNIQUE: Multidetector CT imaging of the head and cervical spine was performed following the standard protocol without intravenous contrast. Multiplanar CT image reconstructions of the cervical spine were also generated. COMPARISON:  None. 11/30/2014 FINDINGS: CT HEAD FINDINGS There is generalized atrophy. There chronic small-vessel ischemic changes affecting the cerebral hemispheric white matter, thalami I and pons. No sign of acute infarction. No mass lesion, hemorrhage, hydrocephalus or extra-axial collection. No skull fracture. No traumatic  fluid in the sinuses, middle ears or mastoids. There is atherosclerotic calcification of the major vessels at the base of the brain. CT CERVICAL SPINE FINDINGS No fracture. No malalignment. No soft tissue swelling. Ordinary chronic spondylosis and facet arthropathy, typical of age. IMPRESSION: Head CT: No acute or traumatic finding. Atrophy and chronic small vessel disease. Cervical spine CT: No acute or traumatic finding. Ordinary degenerative spondylosis. Electronically Signed   By: Paulina Fusi M.D.   On: 01/07/2016 16:51   Pelvis Portable  Result Date: 01/08/2016 CLINICAL DATA:  Postop film for right hip arthroplasty. EXAM: PORTABLE PELVIS 1-2 VIEWS COMPARISON:  None. FINDINGS: Status post right hip arthroplasty. Hardware appears intact and appropriately positioned. No evidence of surgical complicating feature. Expected postsurgical changes within the overlying soft tissues. Adjacent osseous pelvis appears intact and normally aligned. Degenerative change noted in the lower lumbar spine. IMPRESSION: Expected postsurgical changes status post right hip arthroplasty. Hardware appears appropriately positioned. No evidence of surgical complicating feature. Electronically Signed   By: Bary Richard M.D.   On: 01/08/2016 19:00   Dg Chest Port 1 View  Result Date: 01/09/2016 CLINICAL DATA:  Patient with hypoxemia.  Left shoulder pain. EXAM: PORTABLE CHEST 1 VIEW COMPARISON:  Chest radiograph 01/07/2016 FINDINGS: Stable enlarged cardiac and mediastinal contours. Heterogeneous opacities within the lung bases bilaterally. Small bilateral pleural effusions. Stable widening of the left AC joint and appearance of the distal left clavicle. Left shoulder joint degenerative changes. IMPRESSION: Cardiomegaly. Small bilateral pleural effusions and underlying opacities favored to represent atelectasis. Infection not excluded. In the setting of reported left shoulder pain, recommend dedicated evaluation with left shoulder  radiographs. Electronically Signed   By: Annia Belt M.D.   On: 01/09/2016 20:04   Dg Shoulder Left  Result Date: 01/10/2016 CLINICAL DATA:  Swelling and left shoulder pain. EXAM: LEFT SHOULDER - 2+ VIEW COMPARISON:  None. FINDINGS: Portable study with limited positioning and two views. No evidence of acute fracture or dislocation. No notable degenerative spurring. No soft tissue calcification. Intrathoracic findings as seen on dedicated chest x-ray. IMPRESSION: Negative limited portable shoulder. Electronically Signed   By: Marnee Spring M.D.   On: 01/10/2016 03:28   Dg C-arm 1-60 Min  Result Date: 01/08/2016 CLINICAL DATA:  Right anterior total hip  arthroplasty. EXAM: OPERATIVE RIGHT HIP (WITH PELVIS IF PERFORMED)  VIEWS TECHNIQUE: Fluoroscopic spot image(s) were submitted for interpretation post-operatively. COMPARISON:  None. FINDINGS: Two intraoperative fluoroscopic images are provided showing placement of right hip arthroplasty hardware. Hardware appears appropriately positioned. No evidence of surgical complicating feature. Fluoroscopy provided for 18 seconds. IMPRESSION: Intraoperative fluoroscopic images from a total hip arthroplasty. No evidence of surgical complicating feature on these limited images. Fluoroscopy provided for 18 seconds. Electronically Signed   By: Bary Richard M.D.   On: 01/08/2016 18:09   Dg Hip Operative Unilat W Or W/o Pelvis Right  Result Date: 01/08/2016 CLINICAL DATA:  Right anterior total hip arthroplasty. EXAM: OPERATIVE RIGHT HIP (WITH PELVIS IF PERFORMED)  VIEWS TECHNIQUE: Fluoroscopic spot image(s) were submitted for interpretation post-operatively. COMPARISON:  None. FINDINGS: Two intraoperative fluoroscopic images are provided showing placement of right hip arthroplasty hardware. Hardware appears appropriately positioned. No evidence of surgical complicating feature. Fluoroscopy provided for 18 seconds. IMPRESSION: Intraoperative fluoroscopic images from a  total hip arthroplasty. No evidence of surgical complicating feature on these limited images. Fluoroscopy provided for 18 seconds. Electronically Signed   By: Bary Richard M.D.   On: 01/08/2016 18:09   Dg Hip Unilat W Or Wo Pelvis 2-3 Views Left  Result Date: 01/07/2016 CLINICAL DATA:  Fall with hip pain, initial encounter EXAM: DG HIP (WITH OR WITHOUT PELVIS) 2-3V LEFT COMPARISON:  11/30/2014 FINDINGS: There is a subcapital femoral neck fracture on the right with some impaction and angulation at the fracture site. Degenerative changes of both hip joints are seen. Pelvic ring is intact. No definitive left hip fracture is seen. IMPRESSION: Subcapital right femoral neck fracture. Electronically Signed   By: Alcide Clever M.D.   On: 01/07/2016 17:50    Subjective:  Feeling better today.  Feels like her thinking is clearer.  Denies chest pain, shortness of breath, nausea.  Bowels are moving.  Right leg still hurts when she moves.    Discharge Exam: Vitals:   01/13/16 1300 01/13/16 1500  BP: (!) 146/69 111/76  Pulse: (!) 108 99  Resp:    Temp: 98.6 F (37 C) 98.9 F (37.2 C)   Vitals:   01/12/16 2023 01/13/16 0659 01/13/16 1300 01/13/16 1500  BP: (!) 142/66 (!) 143/68 (!) 146/69 111/76  Pulse: 98 (!) 109 (!) 108 99  Resp:      Temp: 98.2 F (36.8 C) 97.9 F (36.6 C) 98.6 F (37 C) 98.9 F (37.2 C)  TempSrc: Oral Oral Oral Oral  SpO2: 97% 93% 95% 100%  Weight:      Height:        General exam:  Adult female.  Sitting in chair, awake, alert, conversant with a soft voice, slow to answer questions, but cracking jokes.     HEENT:  NCAT, MMM Respiratory system:  CTAB  Cardiovascular system:  Normal S1/S2. No murmurs, rubs, gallops or clicks.  Warm extremities Gastrointestinal system: Normal active bowel sounds, soft, nondistended, nontender. MSK:  Normal tone and bulk, right lower extremity edema,  Incision visualized today (dressing pulled off by patient), edges well approximated, no  erythema, induration, or purulence.  Swelling present, but minimal bruising and no palpable hematoma Neuro:  Flat affect. Cogwheel rigidity   The results of significant diagnostics from this hospitalization (including imaging, microbiology, ancillary and laboratory) are listed below for reference.     Microbiology: Recent Results (from the past 240 hour(s))  MRSA PCR Screening     Status: None   Collection  Time: 01/08/16  2:56 AM  Result Value Ref Range Status   MRSA by PCR NEGATIVE NEGATIVE Final    Comment:        The GeneXpert MRSA Assay (FDA approved for NASAL specimens only), is one component of a comprehensive MRSA colonization surveillance program. It is not intended to diagnose MRSA infection nor to guide or monitor treatment for MRSA infections.   Culture, blood (routine x 2)     Status: None   Collection Time: 01/08/16  9:20 AM  Result Value Ref Range Status   Specimen Description BLOOD LEFT HAND  Final   Special Requests IN PEDIATRIC BOTTLE 1 CC  Final   Culture   Final    NO GROWTH 5 DAYS Performed at [facility]    Report Status 01/13/2016 FINAL  Final  Culture, blood (routine x 2)     Status: None   Collection Time: 01/08/16  9:29 AM  Result Value Ref Range Status   Specimen Description BLOOD LEFT HAND  Final   Special Requests BOTTLES DRAWN AEROBIC ONLY 1 CC  Final   Culture   Final    NO GROWTH 5 DAYS Performed at [facility]    Report Status 01/13/2016 FINAL  Final     Labs: BNP (last 3 results) No results for input(s): BNP in the last 8760 hours. Basic Metabolic Panel:  Recent Labs Lab 01/10/16 0343 01/11/16 0549 01/12/16 0642 01/13/16 0626  NA 137 138 140 141  K 3.9 3.7 3.8 3.6  CL 109 109 111 106  CO2 22 22 21* 23  GLUCOSE 131* 114* 99 106*  BUN 32* 27* 21* 20  CREATININE 2.30* 1.83* 1.46* 1.41*  CALCIUM 8.0* 8.4* 8.5* 9.1   Liver Function Tests: No results for input(s): AST, ALT, ALKPHOS, BILITOT, PROT, ALBUMIN in  the last 168 hours. No results for input(s): LIPASE, AMYLASE in the last 168 hours. No results for input(s): AMMONIA in the last 168 hours. CBC:  Recent Labs Lab 01/10/16 0343 01/11/16 0549 01/12/16 0642 01/13/16 0626  WBC 8.7 8.2 7.7 8.2  HGB 8.6* 8.3* 8.5* 9.5*  HCT 28.6* 27.1* 27.8* 31.1*  MCV 93.8 92.8 93.0 93.1  PLT 181 207 214 266   Cardiac Enzymes: No results for input(s): CKTOTAL, CKMB, CKMBINDEX, TROPONINI in the last 168 hours. BNP: Invalid input(s): POCBNP CBG:  Recent Labs Lab 01/10/16 0614 01/10/16 1634 01/10/16 2117 01/11/16 0645  GLUCAP 166* 130* 120* 128*   D-Dimer No results for input(s): DDIMER in the last 72 hours. Hgb A1c No results for input(s): HGBA1C in the last 72 hours. Lipid Profile No results for input(s): CHOL, HDL, LDLCALC, TRIG, CHOLHDL, LDLDIRECT in the last 72 hours. Thyroid function studies No results for input(s): TSH, T4TOTAL, T3FREE, THYROIDAB in the last 72 hours.  Invalid input(s): FREET3 Anemia work up No results for input(s): VITAMINB12, FOLATE, FERRITIN, TIBC, IRON, RETICCTPCT in the last 72 hours. Urinalysis No results found for: COLORURINE, APPEARANCEUR, LABSPEC, PHURINE, GLUCOSEU, HGBUR, BILIRUBINUR, KETONESUR, PROTEINUR, UROBILINOGEN, NITRITE, LEUKOCYTESUR Sepsis Labs Invalid input(s): PROCALCITONIN,  WBC,  LACTICIDVEN   Time coordinating discharge: Over 30 minutes  SIGNED:   Renae Fickle, MD  [facility] 01/16/2016, 4:16 PM Pager   If 7PM-7AM, please contact night-coverage [email or web address] Password [email or web address]

## 2016-01-13 NOTE — Progress Notes (Signed)
ANTICOAGULATION CONSULT FOLLOW-UP NOTE   Pharmacy Consult:  Coumadin Indication: atrial fibrillation and VTE prophylaxis post-op hip surgery  Allergies  Allergen Reactions  . Clonidine Derivatives     Reaction unknown  . Lisinopril     Reaction unknown  . Macrobid [Nitrofurantoin [employer]ohyd Macro] Hives  . Sulfa Antibiotics     Reaction unknown  . Valsartan     Reaction unknown    Patient Measurements: Height: 5\' 7"  (170.2 cm) Weight: 181 lb 7 oz (82.3 kg) IBW/kg (Calculated) : 61.6  Vital Signs: Temp: 97.9 F (36.6 C) (08/26 0659) Temp Source: Oral (08/26 0659) BP: 143/68 (08/26 0659) Pulse Rate: 109 (08/26 0659)  Labs:  Recent Labs  01/11/16 0549 01/12/16 0642 01/13/16 0626  HGB 8.3* 8.5* 9.5*  HCT 27.1* 27.8* 31.1*  PLT 207 214 266  LABPROT 28.0* 28.5* 30.2*  INR 2.56 2.62 2.82  CREATININE 1.83* 1.46* 1.41*    Estimated Creatinine Clearance: 32.8 mL/min (by C-G formula based on SCr of 1.41 mg/dL).   Assessment: 89 yoF continues on Coumadin for history of Afib and remote DVT, now s/p R hip hemiarhtroplasty after fall.  INR remains therapeutic at 2.82 today, Hgb stable, no noted S/Sx bleeding.    Goal of Therapy:  INR 2-3    Plan:  -Coumadin 2 mg PO x1 tonight -Daily PT / INR -Monitor S/Sx bleeding and Hgb  Fredonia HighlandMichael Shawndra Clute, PharmD PGY-1 Pharmacy Resident Pager: [phone] 01/13/2016

## 2016-01-13 NOTE — Progress Notes (Signed)
Physical Therapy Treatment Patient Details Name: Sheila Watkins MRN: [id number] DOB: 03-17-1930 Today's Date: 01/13/2016    History of Present Illness Patient is a 82 y/o female with hx of PD, HTN, DVT, DM, CKD and A-fib presents s/p right hip hemiarthoplasty after fall at home.    PT Comments    Pt presented sitting OOB in recliner, awake and willing to participate in therapy session. Pt's son was present throughout session. Pt making good progress since pervious sessions and required min A to perform STS and SPT transfers. Pt responding very well to son's VCs. Pt would continue to benefit from skilled physical therapy services at this time while admitted and after d/c to address her limitations in order to improve her overall safety and independence with functional mobility.   Follow Up Recommendations  SNF     Equipment Recommendations  None recommended by PT    Recommendations for Other Services       Precautions / Restrictions Precautions Precautions: Fall Restrictions Weight Bearing Restrictions: Yes RLE Weight Bearing: Weight bearing as tolerated    Mobility  Bed Mobility Overal bed mobility: Needs Assistance;+2 for physical assistance Bed Mobility: Sit to Supine       Sit to supine: Mod assist   General bed mobility comments: pt required mod A to return supine in bed; however, required total A x2 to scoot up in bed for better positioning  Transfers Overall transfer level: Needs assistance Equipment used: None Transfers: Sit to/from [employer] Sit to Stand: Min assist Stand pivot transfers: Min guard       General transfer comment: Pt required VC'ing from son and increased time  Ambulation/Gait                 Stairs            Wheelchair Mobility    Modified Rankin (Stroke Patients Only)       Balance Overall balance assessment: Needs assistance Sitting-balance support: Feet supported;Bilateral upper extremity  supported Sitting balance-Leahy Scale: Poor     Standing balance support: Bilateral upper extremity supported Standing balance-Leahy Scale: Poor                      Cognition Arousal/Alertness: Awake/alert Behavior During Therapy: WFL for tasks assessed/performed Overall Cognitive Status: Within Functional Limits for tasks assessed                      Exercises      General Comments        Pertinent Vitals/Pain Pain Assessment: No/denies pain Faces Pain Scale: No hurt Pain Intervention(s): Monitored during session    Home Living                      Prior Function            PT Goals (current goals can now be found in the care plan section) Acute Rehab PT Goals Patient Stated Goal: son is now accepting of SNF placement (planned d/c today) PT Goal Formulation: With patient/family Time For Goal Achievement: 01/23/16 Potential to Achieve Goals: Fair Progress towards PT goals: Progressing toward goals    Frequency  Min 3X/week    PT Plan Current plan remains appropriate    Co-evaluation             End of Session Equipment Utilized During Treatment: Gait belt Activity Tolerance: Patient limited by fatigue Patient left: in bed;with call bell/phone  within reach;with family/visitor present     Time: [id number] PT Time Calculation (min) (ACUTE ONLY): 20 min  Charges:  $Therapeutic Activity: 8-22 mins                    G CodesAlessandra Bevels:      Bradyn Soward M Alessander Sikorski 01/13/2016, 2:09 PM Deborah ChalkJennifer Carolee Channell, PT, DPT [phone]

## 2016-01-13 NOTE — Progress Notes (Addendum)
Subjective: 5 Days Post-Op Procedure(s) (LRB): ANTERIOR APPROACH ARTHROPLASTY BIPOLAR HIP (HEMIARTHROPLASTY) (Right) Patient reports pain as mild.    Objective: Vital signs in last 24 hours: Temp:  [97.6 F (36.4 C)-98.2 F (36.8 C)] 97.9 F (36.6 C) (08/26 0659) Pulse Rate:  [78-109] 109 (08/26 0659) Resp:  [18] 18 (08/25 1500) BP: (132-143)/(66-74) 143/68 (08/26 0659) SpO2:  [93 %-100 %] 93 % (08/26 0659)  Intake/Output from previous day: 08/25 0701 - 08/26 0700 In: 75 [P.O.:75] Out: -  Intake/Output this shift: No intake/output data recorded.   Recent Labs  01/11/16 0549 01/12/16 0642 01/13/16 0626  HGB 8.3* 8.5* 9.5*    Recent Labs  01/12/16 0642 01/13/16 0626  WBC 7.7 8.2  RBC 2.99* 3.34*  HCT 27.8* 31.1*  PLT 214 266    Recent Labs  01/12/16 0642 01/13/16 0626  NA 140 141  K 3.8 3.6  CL 111 106  CO2 21* 23  BUN 21* 20  CREATININE 1.46* 1.41*  GLUCOSE 99 106*  CALCIUM 8.5* 9.1    Recent Labs  01/12/16 0642 01/13/16 0626  INR 2.62 2.82    Neurologically intact ABD soft Neurovascular intact Sensation intact distally Intact pulses distally Dorsiflexion/Plantar flexion intact Incision: dressing C/D/I and no drainage Compartment soft no calf pain or sign of DVT  Assessment/Plan: 5 Days Post-Op Procedure(s) (LRB): ANTERIOR APPROACH ARTHROPLASTY BIPOLAR HIP (HEMIARTHROPLASTY) (Right) Advance diet Up with therapy D/C IV fluids  Orthopedically stable for D/C today D/C per medial (admitting) service WBAT Coumadin Follow up with Dr. Linna CapriceSwinteck in 2 weeks as outpatient  BISSELL, Lockie ParesJACLYN M. 01/13/2016, 9:13 AM

## 2016-01-13 NOTE — Plan of Care (Signed)
Problem: Safety: Goal: Ability to remain free from injury will improve Outcome: Progressing Safety precautions and fall prevention maintained  Problem: Pain Managment: Goal: General experience of comfort will improve Outcome: Progressing Medicated once for pain this shift  Problem: Physical Regulation: Goal: Will remain free from infection Outcome: Progressing No s/s of infection noted  Problem: Skin Integrity: Goal: Risk for impaired skin integrity will decrease Outcome: Progressing Mepilex dry and intact to right hip incision, old bruise noted to both arms  Problem: Tissue Perfusion: Goal: Risk factors for ineffective tissue perfusion will decrease Outcome: Progressing No s/s of dvt noted  Problem: Nutrition: Goal: Adequate nutrition will be maintained Outcome: Progressing Given Ensure for nutritional supplement and took 1/2 bottle  Problem: Bowel/Gastric: Goal: Will not experience complications related to bowel motility Outcome: Progressing No bowel issues noted

## 2016-01-16 DIAGNOSIS — D62 Acute posthemorrhagic anemia: Secondary | ICD-10-CM

## 2016-09-20 ENCOUNTER — Encounter (HOSPITAL_BASED_OUTPATIENT_CLINIC_OR_DEPARTMENT_OTHER): Payer: Medicare Other

## 2016-10-18 DEATH — deceased

## 2017-05-22 IMAGING — DX DG CHEST 1V PORT
1 series · 1 of 1 positions shown · non-contrast
Comparison: Chest radiograph 01/07/2016

CLINICAL DATA: Patient with hypoxemia.  Left shoulder pain.

EXAM:
PORTABLE CHEST 1 VIEW

[chest ap]
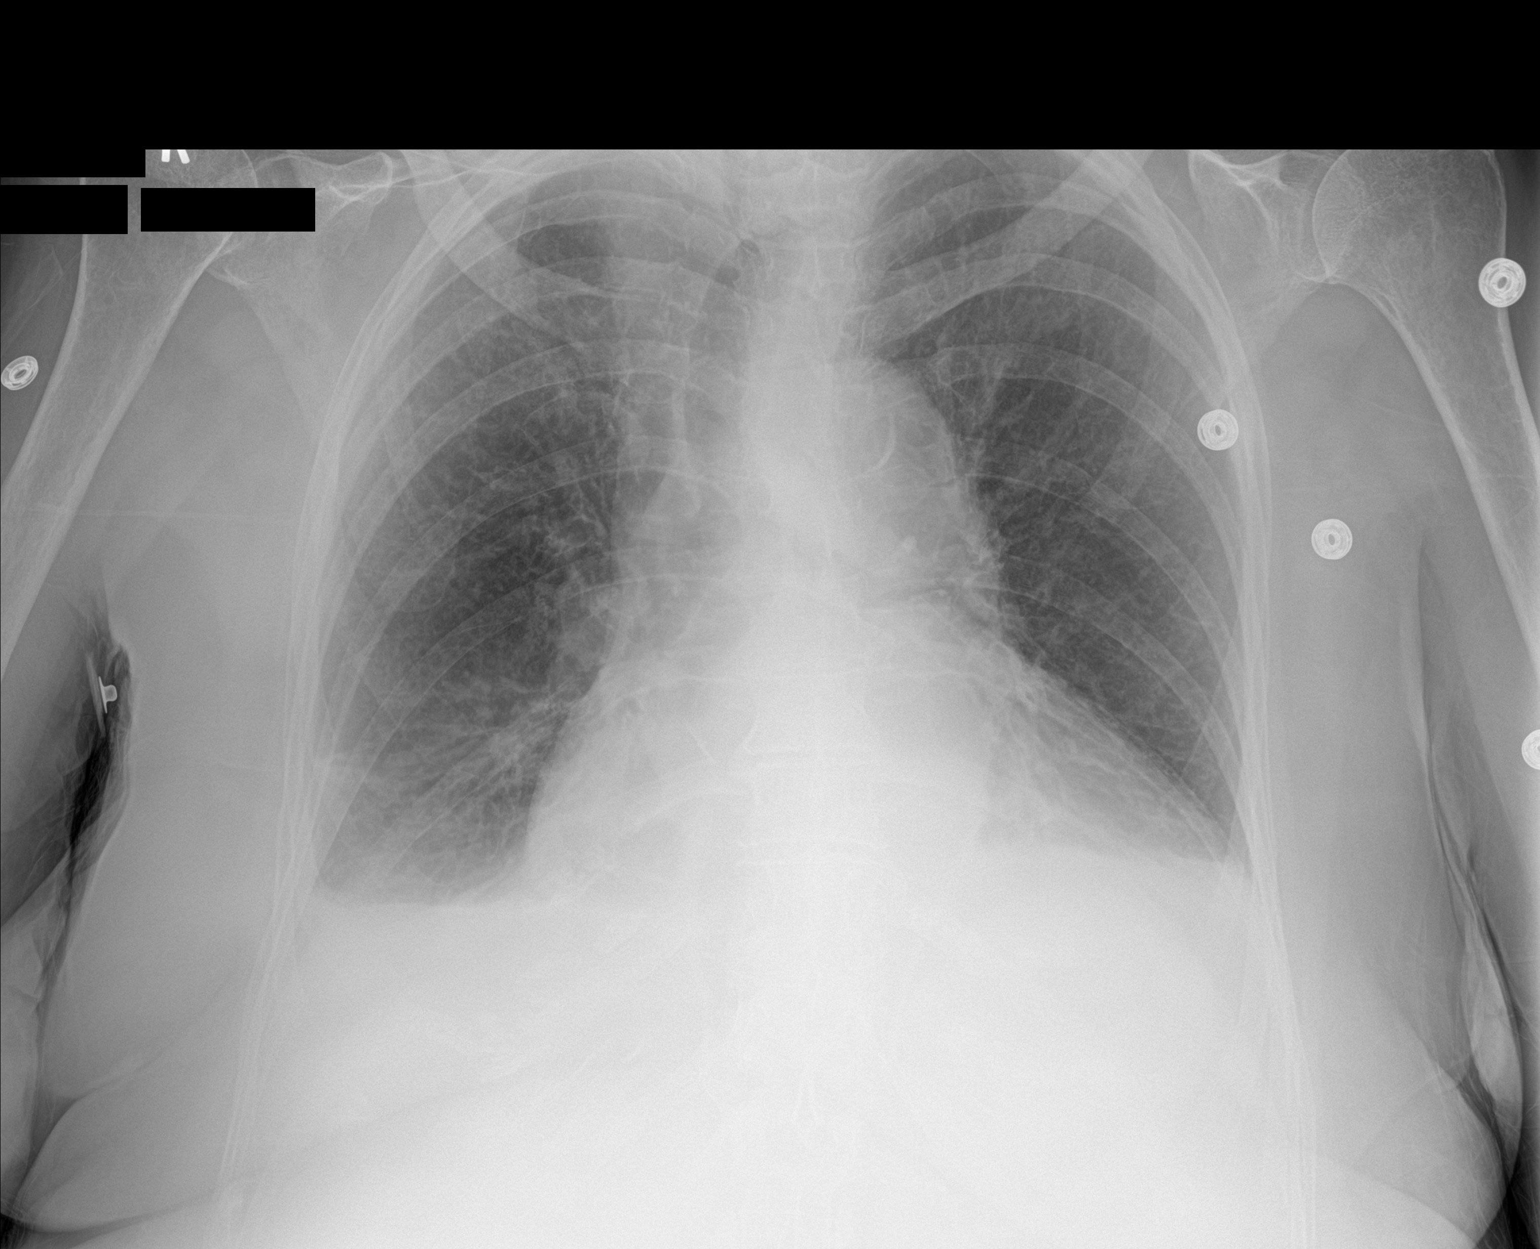

[1 of 1 positions shown; findings below may reference images not displayed]

FINDINGS: Stable enlarged cardiac and mediastinal contours. Heterogeneous
opacities within the lung bases bilaterally. Small bilateral pleural
effusions. Stable widening of the left AC joint and appearance of
the distal left clavicle. Left shoulder joint degenerative changes.
IMPRESSION: Cardiomegaly.

Small bilateral pleural effusions and underlying opacities favored
to represent atelectasis. Infection not excluded.

In the setting of reported left shoulder pain, recommend dedicated
evaluation with left shoulder radiographs.

## 2017-05-22 IMAGING — CR DG SHOULDER 2+V*L*
2 series · 2 of 2 positions shown · non-contrast
Comparison: None.

CLINICAL DATA: Swelling and left shoulder pain.

EXAM:
LEFT SHOULDER - 2+ VIEW

[AP (1 of 2)]
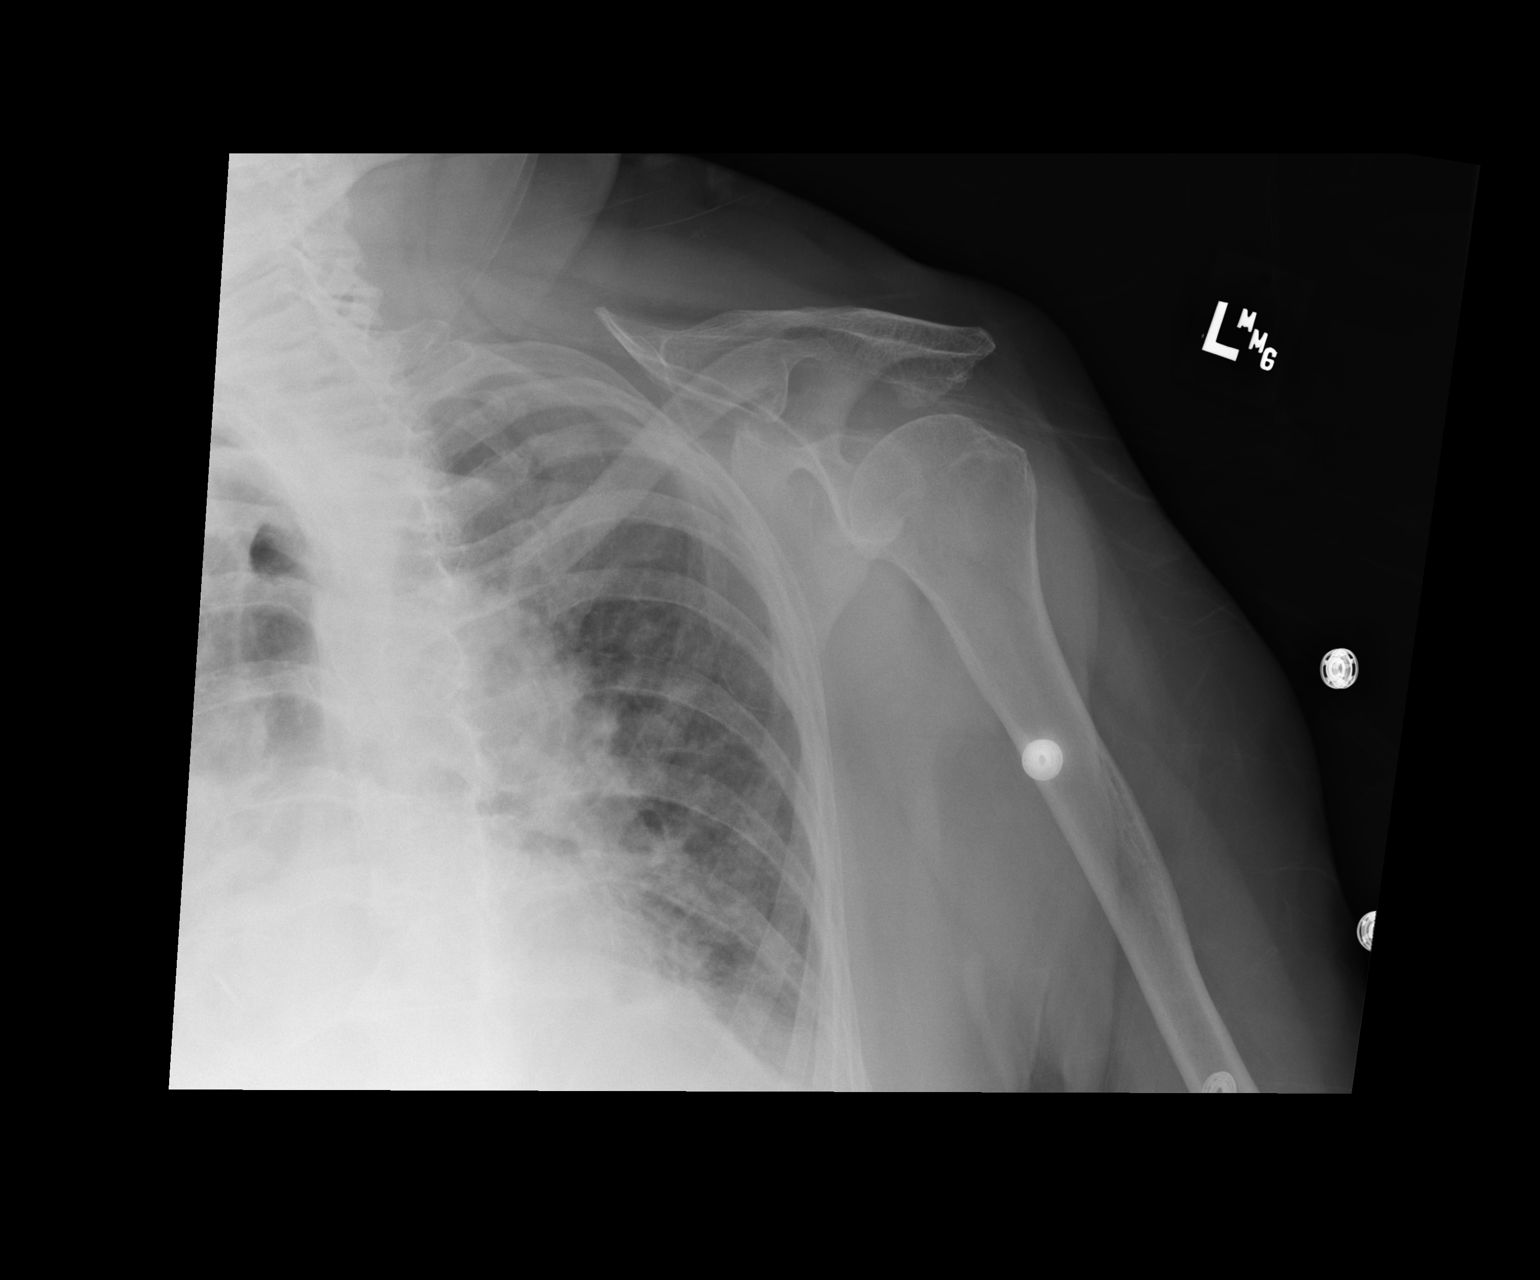

[AP (2 of 2)]
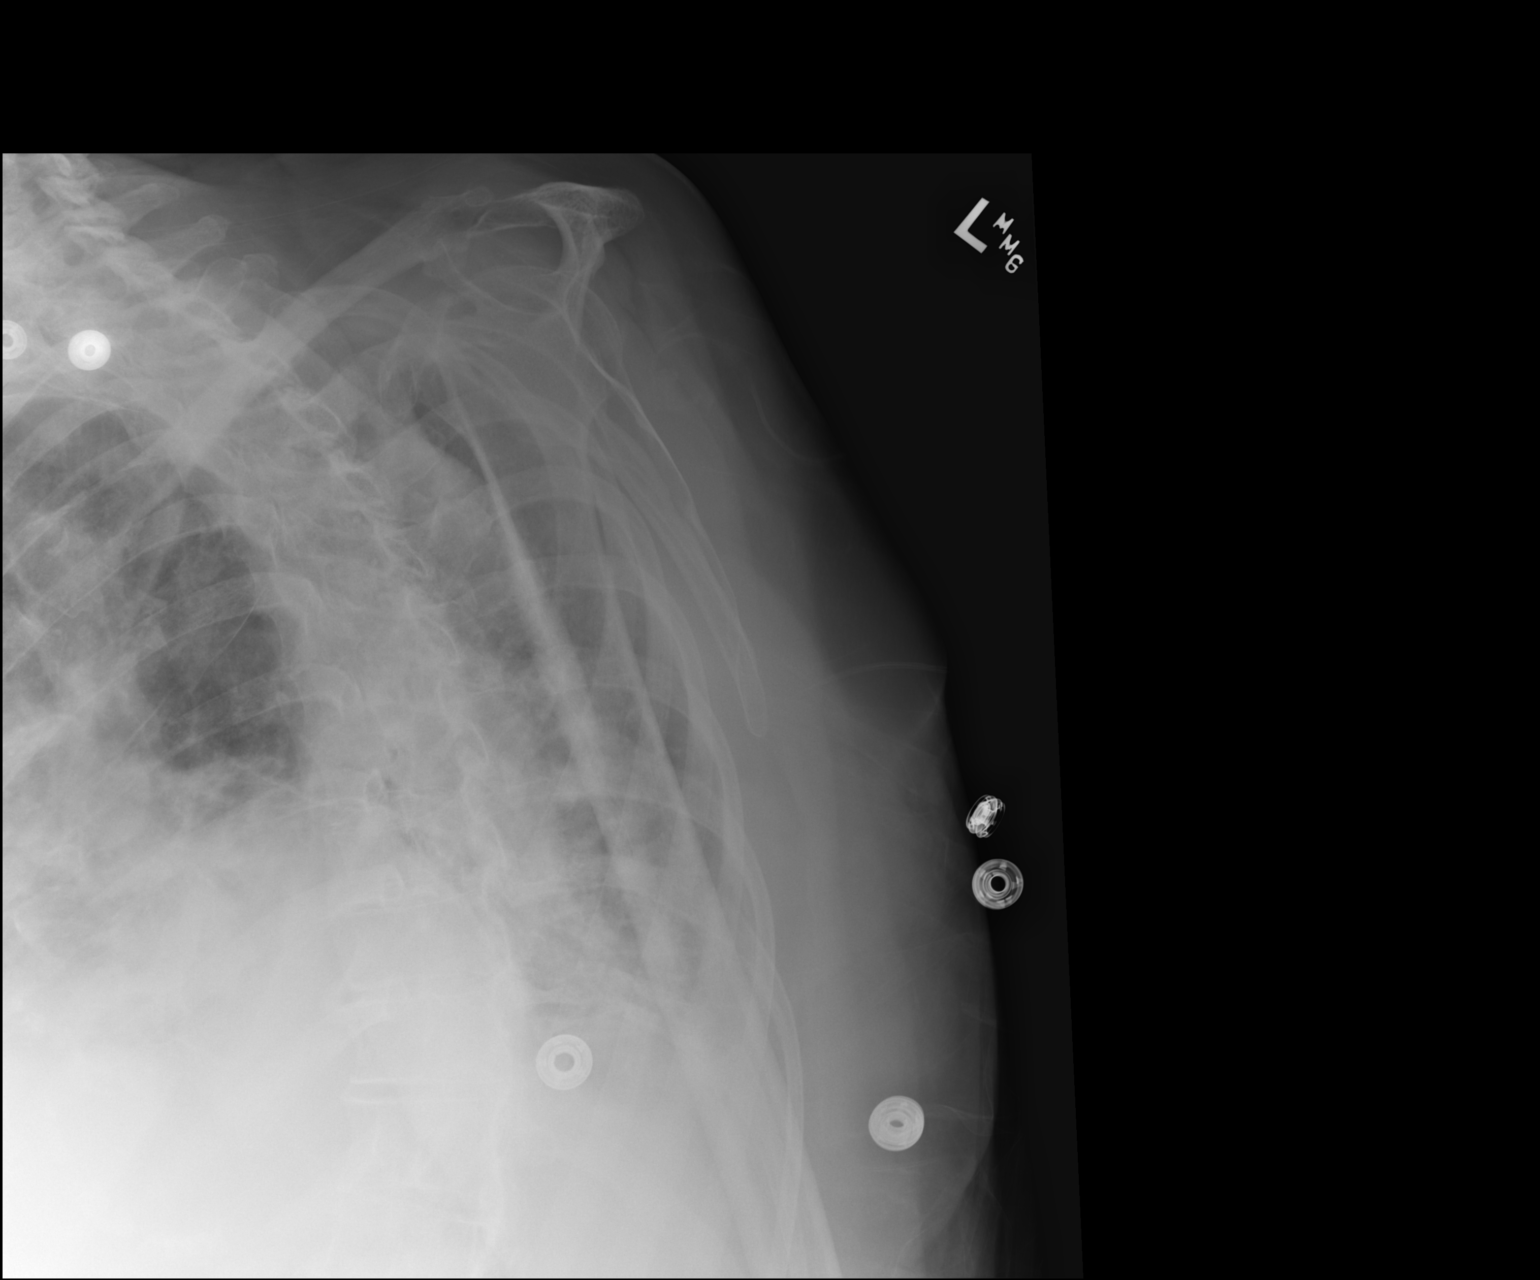

[2 of 2 positions shown; findings below may reference images not displayed]

FINDINGS: Portable study with limited positioning and two views. No evidence
of acute fracture or dislocation. No notable degenerative spurring.
No soft tissue calcification.

Intrathoracic findings as seen on dedicated chest x-ray.
IMPRESSION: Negative limited portable shoulder.
